# Patient Record
Sex: Female | Born: 1980 | Race: Black or African American | Hispanic: No | Marital: Married | Smoking: Never smoker
Health system: Southern US, Community
[De-identification: ages and names within clinical notes are randomized; demographics above are authoritative.]

## PROBLEM LIST (undated history)

## (undated) DIAGNOSIS — R519 Headache, unspecified: Secondary | ICD-10-CM

## (undated) DIAGNOSIS — C801 Malignant (primary) neoplasm, unspecified: Secondary | ICD-10-CM

## (undated) DIAGNOSIS — I10 Essential (primary) hypertension: Secondary | ICD-10-CM

## (undated) DIAGNOSIS — F419 Anxiety disorder, unspecified: Secondary | ICD-10-CM

## (undated) HISTORY — DX: Essential (primary) hypertension: I10

## (undated) HISTORY — PX: NO PAST SURGERIES: SHX2092

## (undated) HISTORY — PX: BREAST SURGERY: SHX581

---

## 2002-06-02 ENCOUNTER — Ambulatory Visit (HOSPITAL_COMMUNITY): Admission: RE | Admit: 2002-06-02 | Discharge: 2002-06-02 | Payer: Self-pay | Admitting: Family Medicine

## 2002-06-02 ENCOUNTER — Encounter: Payer: Self-pay | Admitting: Family Medicine

## 2003-01-11 ENCOUNTER — Other Ambulatory Visit: Admission: RE | Admit: 2003-01-11 | Discharge: 2003-01-11 | Payer: Self-pay | Admitting: Obstetrics and Gynecology

## 2005-11-11 ENCOUNTER — Ambulatory Visit (HOSPITAL_COMMUNITY): Admission: RE | Admit: 2005-11-11 | Discharge: 2005-11-11 | Payer: Self-pay | Admitting: Family Medicine

## 2007-09-11 ENCOUNTER — Emergency Department (HOSPITAL_COMMUNITY): Admission: EM | Admit: 2007-09-11 | Discharge: 2007-09-11 | Payer: Self-pay | Admitting: Emergency Medicine

## 2008-01-31 ENCOUNTER — Other Ambulatory Visit: Admission: RE | Admit: 2008-01-31 | Discharge: 2008-01-31 | Payer: Self-pay | Admitting: Obstetrics and Gynecology

## 2009-12-04 ENCOUNTER — Ambulatory Visit: Payer: Self-pay | Admitting: Family Medicine

## 2009-12-04 DIAGNOSIS — E663 Overweight: Secondary | ICD-10-CM | POA: Insufficient documentation

## 2009-12-04 DIAGNOSIS — J309 Allergic rhinitis, unspecified: Secondary | ICD-10-CM | POA: Insufficient documentation

## 2009-12-04 DIAGNOSIS — R5383 Other fatigue: Secondary | ICD-10-CM

## 2009-12-04 DIAGNOSIS — R5381 Other malaise: Secondary | ICD-10-CM

## 2009-12-04 DIAGNOSIS — I10 Essential (primary) hypertension: Secondary | ICD-10-CM | POA: Insufficient documentation

## 2009-12-12 ENCOUNTER — Encounter: Payer: Self-pay | Admitting: Family Medicine

## 2009-12-12 LAB — CONVERTED CEMR LAB
BUN: 13 mg/dL (ref 6–23)
Basophils Absolute: 0 10*3/uL (ref 0.0–0.1)
Basophils Relative: 1 % (ref 0–1)
CO2: 19 meq/L (ref 19–32)
Calcium: 9.8 mg/dL (ref 8.4–10.5)
Cholesterol: 189 mg/dL (ref 0–200)
Eosinophils Relative: 4 % (ref 0–5)
Glucose, Bld: 78 mg/dL (ref 70–99)
HCT: 42.9 % (ref 36.0–46.0)
Hemoglobin: 14.5 g/dL (ref 12.0–15.0)
Leukocytes, UA: NEGATIVE
MCHC: 33.8 g/dL (ref 30.0–36.0)
Monocytes Absolute: 0.4 10*3/uL (ref 0.1–1.0)
Nitrite: NEGATIVE
Potassium: 3.4 meq/L — ABNORMAL LOW (ref 3.5–5.3)
Protein, ur: NEGATIVE mg/dL
RDW: 13.8 % (ref 11.5–15.5)
TSH: 2.09 microintl units/mL (ref 0.350–4.500)
Urine Glucose: NEGATIVE mg/dL
VLDL: 14 mg/dL (ref 0–40)

## 2009-12-26 ENCOUNTER — Ambulatory Visit: Payer: Self-pay | Admitting: Family Medicine

## 2009-12-26 ENCOUNTER — Other Ambulatory Visit: Admission: RE | Admit: 2009-12-26 | Discharge: 2009-12-26 | Payer: Self-pay | Admitting: Family Medicine

## 2009-12-26 DIAGNOSIS — N760 Acute vaginitis: Secondary | ICD-10-CM | POA: Insufficient documentation

## 2009-12-28 ENCOUNTER — Encounter: Payer: Self-pay | Admitting: Family Medicine

## 2009-12-29 ENCOUNTER — Encounter: Payer: Self-pay | Admitting: Family Medicine

## 2009-12-29 LAB — CONVERTED CEMR LAB: GC Probe Amp, Genital: NEGATIVE

## 2010-01-03 ENCOUNTER — Telehealth: Payer: Self-pay | Admitting: Family Medicine

## 2010-01-03 LAB — CONVERTED CEMR LAB: Candida species: NEGATIVE

## 2010-03-05 ENCOUNTER — Ambulatory Visit: Payer: Self-pay | Admitting: Family Medicine

## 2010-10-21 NOTE — Progress Notes (Signed)
Summary: returning your call  Phone Note Call from Patient   Summary of Call: returning your call call back at [phone] Initial call taken by: Lind Guest,  January 03, 2010 8:04 AM  Follow-up for Phone Call        Patient aware that she has BV and meds sent to [facility] Follow-up by: Everitt Amber LPN,  January 03, 2010 8:09 AM

## 2010-10-21 NOTE — Assessment & Plan Note (Signed)
Summary: new pt   Vital Signs:  Patient profile:   30 year old female Menstrual status:  irregular Height:      72 inches Weight:      187 pounds BMI:     25.45 O2 Sat:      99 % Pulse rate:   96 / minute Pulse rhythm:   regular Resp:     16 per minute BP sitting:   158 / 120  (left arm) Cuff size:   large  Vitals Entered By: Everitt Amber LPN (December 04, 2009 8:51 AM)  Nutrition Counseling: Patient's BMI is greater than 25 and therefore counseled on weight management options. CC: New Patient Is Patient Diabetic? No Pain Assessment Patient in pain? no          Menstrual Status irregular   Primary Care Provider:  Syliva Overman MD  CC:  New Patient.  History of Present Illness: new pt eval for this young female whose primary concern todayis hypertension which is uncontrolled and for which  she  has been off meds for some time. She is slightly overweight and does not exercise. Her immunisation is not up to date, and she states she had an abn pap and was not offered the gardasil vaccine.  Preventive Screening-Counseling & Management  Alcohol-Tobacco     Smoking Status: never  Caffeine-Diet-Exercise     Does Patient Exercise: no      Drug Use:  no.    Current Medications (verified): 1)  None  Allergies (verified): No Known Drug Allergies  Past History:  Past Medical History: HTN since Feb 20, 2009 Overweight  Headache went to the ed with this in Feb 21, 2008 and at that time was told she had HTN, occasional headaches, often perimenstrual Seasonal allergies, wearly Spring , and fall Abn pap in 02/21/2008, was treated, Dr Emelda Fear  Past Surgical History: Denies surgical history  Family History: Mother-deceased- in February 21, 2007, age 35,Diabetes, ALS Father-Living-HTN, hyperlipidemia age 41 1 sister-living-healthy, thyroid ds, possible hypothyroid 1 brother-living-healthy  Social History: Higher education careers adviser, 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 Eyes:  Complains of vision loss-both eyes; denies blurring and discharge; wears prescription lenses x 6 months approx, poordistance vision. ENT:  Denies earache, hoarseness, nasal congestion, sinus pressure, and sore throat. CV:  Denies chest pain or discomfort, difficulty breathing while lying down, palpitations, shortness of breath with exertion, and swelling of feet. Resp:  Denies cough and sputum productive. GI:  Denies abdominal pain, change in bowel habits, constipation, diarrhea, nausea, and vomiting. GU:  Denies discharge, dysuria, and urinary frequency; 2 month h/o l;oweer. Derm:  Denies itching, lesion(s), and rash. Neuro:  Denies falling down, headaches, seizures, and sensation of room spinning. Psych:  Denies anxiety and depression. Endo:  Denies cold intolerance, excessive hunger, excessive thirst, excessive urination, heat intolerance, polyuria, and weight change. Heme:  Denies abnormal bruising and bleeding. Allergy:  Complains of seasonal allergies; mild.  Physical Exam  General:  Well-developed,well-nourished,in no acute distress; alert,appropriate and cooperative throughout examination HEENT: No facial asymmetry,  EOMI, No sinus tenderness, TM's Clear, oropharynx  pink and moist.   Chest: Clear to auscultation bilaterally.  CVS: S1, S2, No murmurs, No S3.   Abd: Soft, Nontender.  MS: Adequate ROM spine, hips, shoulders and knees.  Ext: No edema.   CNS: CN 2-12 intact, power tone and sensation normal throughout.   Skin: Intact, no visible lesions or rashes.  Psych: Good eye contact, normal affect.  Memory intact, not anxious or depressed appearing.     Impression & Recommendations:  Problem # 1:  HYPERTENSION  (ICD-401.9) Assessment Comment Only  Her updated medication list for this problem includes:    Maxzide-25 37.5-25 Mg Tabs (Triamterene-hctz) .Marland Kitchen... Take 1 tablet by mouth once a day  Orders: T-Basic Metabolic Panel [phone]) EKG w/ Interpretation (93000) T-Urinalysis (81003-65000)  BP today: 158/120  Problem # 2:  OVERWEIGHT (ICD-278.02) Assessment: Comment Only  Ht: 72 (12/04/2009)   Wt: 187 (12/04/2009)   BMI: 25.45 (12/04/2009), pt counselled on lifestyle change to facilitate weight loss  Problem # 3:  ALLERGIC RHINITIS (ICD-477.9)  Complete Medication List: 1)  Maxzide-25 37.5-25 Mg Tabs (Triamterene-hctz) .... Take 1 tablet by mouth once a day  Other Orders: T-CBC w/Diff ([id number]) T-Lipid Profile ([id number]) T-TSH [phone]) Tdap => 64yrs IM (44010) Admin 1st Vaccine (27253) Admin 1st Vaccine [facility]) [phone])  Patient Instructions: 1)  f/u in 3 weeks. 2)  Your blood pressure is very high. 3)  It is very important that you take medication daily as prescribed. 4)  It is very impt that you start daily physical activity fo at least 30 mins and eat alot of fruit  and veg and reduce your salt intake. 5)  TdaP today Prescriptions: MAXZIDE-25 37.5-25 MG TABS (TRIAMTERENE-HCTZ) Take 1 tablet by mouth once a day  #30 x 1   Entered and Authorized by:   Syliva Overman MD   Signed by:   Syliva Overman MD on 12/04/2009   Method used:   Electronically to        [facility] (retail)       [address]       [place], [place]  [address]       Ph: [phone]       Fax: [phone]   RxID:   [id number]     Tetanus/Td Vaccine    Vaccine Type: Tdap    Site: right deltoid    Mfr: Sanofi Pasteur    Dose: 0.5 ml    Route: IM    Given by: Everitt Amber LPN    Exp. Date: 12/27/2011    Lot #: [id number]

## 2010-10-21 NOTE — Assessment & Plan Note (Signed)
Summary: PHY   Vital Signs:  Patient profile:   30 year old female Menstrual status:  irregular Height:      72 inches Weight:      187.25 pounds BMI:     25.49 O2 Sat:      98 % Pulse rate:   103 / minute Pulse rhythm:   regular Resp:     16 per minute BP sitting:   140 / 100  (left arm) Cuff size:   regular  Vitals Entered By: Everitt Amber LPN (December 26, 1608 2:19 PM)  Nutrition Counseling: Patient's BMI is greater than 25 and therefore counseled on weight management options.  Vision Screening:Left eye with correction: 20 / 20 Right eye with correction: 20 / 20 Both eyes with correction: 20 / 20  Color vision testing: normal      Vision Entered By: Everitt Amber LPN (December 27, 9602 2:20 PM)   Primary Care Provider:  Syliva Overman MD   History of Present Illness: Reports  that she has been doing well.  She denies adverse side effects from the BP med she is taking. She also reports changing her dioet somewhat and starting some exercise. Denies recent fever or chills. Denies sinus pressure, nasal congestion , ear pain or sore throat. Denies chest congestion, or cough productive of sputum. Denies chest pain, palpitations, PND, orthopnea or leg swelling. Denies abdominal pain, nausea, vomitting, diarrhea or constipation. Denies change in bowel movements or bloody stool. Denies dysuria , frequency, incontinence or hesitancy. Denies  joint pain, swelling, or reduced mobility. Denies headaches, vertigo, seizures. Denies depression, anxiety or insomnia. Denies  rash, lesions, or itch.     Current Medications (verified): 1)  Maxzide-25 37.5-25 Mg Tabs (Triamterene-Hctz) .... Take 1 Tablet By Mouth Once A Day  Allergies (verified): No Known Drug Allergies  Review of Systems      See HPI Eyes:  Denies blurring and discharge. GU:  Complains of discharge; inc d/c after cycle, wants STD testing. Endo:  Denies cold intolerance, excessive hunger, excessive thirst,  excessive urination, heat intolerance, polyuria, and weight change. Heme:  Denies abnormal bruising and bleeding. Allergy:  Complains of seasonal allergies; denies hives or rash, itching eyes, and sneezing; mild at this time.  Physical Exam  General:  Well-developed,well-nourished,in no acute distress; alert,appropriate and cooperative throughout examination Head:  Normocephalic and atraumatic without obvious abnormalities. No apparent alopecia or balding. Eyes:  No corneal or conjunctival inflammation noted. EOMI. Perrla. Funduscopic exam benign, without hemorrhages, exudates or papilledema. Vision grossly normal. Ears:  External ear exam shows no significant lesions or deformities.  Otoscopic examination reveals clear canals, tympanic membranes are intact bilaterally without bulging, retraction, inflammation or discharge. Hearing is grossly normal bilaterally. Nose:  External nasal examination shows no deformity or inflammation. Nasal mucosa are pink and moist without lesions or exudates. Mouth:  Oral mucosa and oropharynx without lesions or exudates.  Teeth in good repair. Neck:  No deformities, masses, or tenderness noted. Chest Wall:  No deformities, masses, or tenderness noted. Breasts:  No mass, nodules, thickening, tenderness, bulging, retraction, inflamation, nipple discharge or skin changes noted.   Lungs:  Normal respiratory effort, chest expands symmetrically. Lungs are clear to auscultation, no crackles or wheezes. Heart:  Normal rate and regular rhythm. S1 and S2 normal without gallop, murmur, click, rub or other extra sounds. Abdomen:  Bowel sounds positive,abdomen soft and non-tender without masses, organomegaly or hernias noted. Genitalia:  Normal introitus for age, no external lesions, white  vaginal discharge, mucosa pink and moist, no vaginal or cervical lesions, no vaginal atrophy, no friaility or hemorrhage, normal uterus size and position, no adnexal masses or tenderness Msk:   No deformity or scoliosis noted of thoracic or lumbar spine.   Pulses:  R and L carotid,radial,femoral,dorsalis pedis and posterior tibial pulses are full and equal bilaterally Extremities:  No clubbing, cyanosis, edema, or deformity noted with normal full range of motion of all joints.   Neurologic:  No cranial nerve deficits noted. Station and gait are normal. Plantar reflexes are down-going bilaterally. DTRs are symmetrical throughout. Sensory, motor and coordinative functions appear intact. Skin:  Intact without suspicious lesions or rashes Cervical Nodes:  No lymphadenopathy noted Axillary Nodes:  No palpable lymphadenopathy Inguinal Nodes:  No significant adenopathy Psych:  Cognition and judgment appear intact. Alert and cooperative with normal attention span and concentration. No apparent delusions, illusions, hallucinations   Impression & Recommendations:  Problem # 1:  VAGINITIS (ICD-616.10) Assessment Comment Only  Orders: T-Wet Prep by Molecular Probe [phone]) T-Chlamydia & GC Probe, Genital ([id number])  Problem # 2:  OVERWEIGHT (ICD-278.02) Assessment: Unchanged  Ht: 72 (12/26/2009)   Wt: 187.25 (12/26/2009)   BMI: 25.49 (12/26/2009)  Problem # 3:  HYPERTENSION (ICD-401.9) Assessment: Improved  The following medications were removed from the medication list:    Maxzide-25 37.5-25 Mg Tabs (Triamterene-hctz) .Marland Kitchen... Take 1 tablet by mouth once a day Her updated medication list for this problem includes:    Maxzide 75-50 Mg Tabs (Triamterene-hctz) .Marland Kitchen... Take 1 tablet by mouth once a day  BP today: 140/100 Prior BP: 158/120 (12/04/2009)  Labs Reviewed: K+: 3.4 (12/12/2009) Creat: : 0.93 (12/12/2009)   Chol: 189 (12/12/2009)   HDL: 78 (12/12/2009)   LDL: 97 (12/12/2009)   TG: 72 (12/12/2009)  Complete Medication List: 1)  Maxzide 75-50 Mg Tabs (Triamterene-hctz) .... Take 1 tablet by mouth once a day  Other Orders: Pap Smear ([address])  Patient  Instructions: 1)  Please schedule a follow-up appointment in 2 months. 2)  It is important that you exercise regularly at least 20 minutes 5 times a week. If you develop chest pain, have severe difficulty breathing, or feel very tired , stop exercising immediately and seek medical attention. 3)  You need to lose weight. Consider a lower calorie diet and regular exercise.  4)  Dose inc on your bP med, pls start taking tWO daily till done then the new tab will be oNE daily Prescriptions: MAXZIDE 75-50 MG TABS (TRIAMTERENE-HCTZ) Take 1 tablet by mouth once a day  #90 x 0   Entered and Authorized by:   Syliva Overman MD   Signed by:   Syliva Overman MD on 12/26/2009   Method used:   Printed then faxed to ...       [facility] (retail)       [address]       [place], [place]  [address]       Ph: [phone]       Fax: [phone]   RxID:   [id number]      Orders Added: 1)  Est. Patient 18-39 years [99395] 2)  Pap Smear [88150] 3)  T-Wet Prep by Molecular Probe [phone] 4)  T-Chlamydia & GC Probe, Genital [[id number]]

## 2010-10-21 NOTE — Assessment & Plan Note (Signed)
Summary: office visit   Vital Signs:  Patient profile:   30 year old female Menstrual status:  irregular Height:      72 inches Weight:      183 pounds BMI:     24.91 O2 Sat:      93 % Pulse rate:   85 / minute Pulse rhythm:   regular Resp:     16 per minute BP sitting:   138 / 100  (left arm) Cuff size:   regular  Vitals Entered By: Everitt Amber LPN (March 05, 2010 8:38 AM) CC: Follow up chronic problems, BP   Primary Care Provider:  Syliva Overman MD  CC:  Follow up chronic problems and BP.  History of Present Illness: pt in primarily to review her blood pressure as well as to f/u on overweight as it pertAINS TO DIET AND EXERCISE.She has changed both with resultant weight loss, and she denies any adverse s/e from her blood pressure meds . specifically leg swelling , light headedness, or leg cramps. Reports  that she has been doing well. Denies recent fever or chills. Denies sinus pressure, nasal congestion , ear pain or sore throat. Denies chest congestion, or cough productive of sputum. Denies chest pain, palpitations, PND, orthopnea or leg swelling. Denies abdominal pain, nausea, vomitting, diarrhea or constipation. Denies change in bowel movements or bloody stool. Denies dysuria , frequency, incontinence or hesitancy. Denies  joint pain, swelling, or reduced mobility. Denies headaches, vertigo, seizures. Denies depression, anxiety or insomnia. Denies  rash, lesions, or itch.       Current Medications (verified): 1)  Maxzide 75-50 Mg Tabs (Triamterene-Hctz) .... Take 1 Tablet By Mouth Once A Day  Allergies (verified): No Known Drug Allergies  Review of Systems      See HPI Eyes:  Denies blurring, discharge, eye pain, and red eye. Endo:  Denies cold intolerance, excessive thirst, excessive urination, and heat intolerance. Heme:  Denies abnormal bruising and bleeding. Allergy:  Denies hives or rash and itching eyes.  Physical Exam  General:   Well-developed,well-nourished,in no acute distress; alert,appropriate and cooperative throughout examination HEENT: No facial asymmetry,  EOMI, No sinus tenderness, TM's Clear, oropharynx  pink and moist.   Chest: Clear to auscultation bilaterally.  CVS: S1, S2, No murmurs, No S3.   Abd: Soft, Nontender.  MS: Adequate ROM spine, hips, shoulders and knees.  Ext: No edema.   CNS: CN 2-12 intact, power tone and sensation normal throughout.   Skin: Intact, no visible lesions or rashes.  Psych: Good eye contact, normal affect.  Memory intact, not anxious or depressed appearing.    Impression & Recommendations:  Problem # 1:  OVERWEIGHT (ICD-278.02) Assessment Improved  Ht: 72 (03/05/2010)   Wt: 183 (03/05/2010)   BMI: 24.91 (03/05/2010)  Problem # 2:  HYPERTENSION (ICD-401.9) Assessment: Unchanged  The following medications were removed from the medication list:    Amlodipine Besylate 2.5 Mg Tabs (Amlodipine besylate) .Marland Kitchen... Take 1 tablet by mouth once a day Her updated medication list for this problem includes:    Maxzide 75-50 Mg Tabs (Triamterene-hctz) .Marland Kitchen... Take 1 tablet by mouth once a day    Amlodipine Besylate 2.5 Mg Tabs (Amlodipine besylate) .Marland Kitchen... Take 1 tablet by mouth once a day  BP today: 138/100 Prior BP: 140/100 (12/26/2009)  Labs Reviewed: K+: 3.4 (12/12/2009) Creat: : 0.93 (12/12/2009)   Chol: 189 (12/12/2009)   HDL: 78 (12/12/2009)   LDL: 97 (12/12/2009)   TG: 72 (12/12/2009)  Complete Medication List: 1)  Maxzide 75-50 Mg Tabs (Triamterene-hctz) .... Take 1 tablet by mouth once a day 2)  Tri-sprintec 0.18/0.215/0.25 Mg-35 Mcg Tabs (Norgestim-eth estrad triphasic) .... Use as directed 3)  Amlodipine Besylate 2.5 Mg Tabs (Amlodipine besylate) .... Take 1 tablet by mouth once a day  Patient Instructions: 1)  Please schedule a follow-up appointment in 2.5 months. 2)  It is important that you exercise regularly at least 20 minutes 5 times a week. If you develop  chest pain, have severe difficulty breathing, or feel very tired , stop exercising immediately and seek medical attention. 3)  You need to lose weight. Consider a lower calorie diet and regular exercise.  4)  Check your Blood Pressure regularly. If it is above: you should make an appointment. Prescriptions: AMLODIPINE BESYLATE 2.5 MG TABS (AMLODIPINE BESYLATE) Take 1 tablet by mouth once a day  #90 x 1   Entered by:   Everitt Amber LPN   Authorized by:   Syliva Overman MD   Signed by:   Everitt Amber LPN on 16/06/9603   Method used:   Printed then faxed to ...       [facility] (retail)       [address]       [place], [place]  [address]       Ph: [phone]       Fax: [phone]   RxID:   [id number] AMLODIPINE BESYLATE 2.5 MG TABS (AMLODIPINE BESYLATE) Take 1 tablet by mouth once a day  #90 x 1   Entered by:   Everitt Amber LPN   Authorized by:   Syliva Overman MD   Signed by:   Everitt Amber LPN on 07/18/2535   Method used:   Printed then faxed to ...       [facility] (retail)       [address]       [place], [place]  [address]       Ph: [phone]       Fax: [phone]   RxID:   [id number] AMLODIPINE BESYLATE 2.5 MG TABS (AMLODIPINE BESYLATE) Take 1 tablet by mouth once a day  #90 x 1   Entered and Authorized by:   Syliva Overman MD   Signed by:   Syliva Overman MD on 03/05/2010   Method used:   Printed then faxed to ...       [facility] (retail)       [address]       [place], [place]  [address]       Ph: [phone]       Fax: [phone]   RxID:   [id number] TRI-SPRINTEC 0.18/0.215/0.25 MG-35 MCG TABS (NORGESTIM-ETH ESTRAD TRIPHASIC) Use as directed  #1 x 11   Entered and Authorized by:   Syliva Overman MD   Signed by:   Syliva Overman MD on 03/05/2010   Method used:   Electronically to        [facility] (retail)        [address]       [place], [place]  [address]       Ph: [phone]       Fax: [phone]   RxID:   [id number] AMLODIPINE BESYLATE 2.5 MG TABS (AMLODIPINE BESYLATE) Take 1 tablet by mouth  once a day  #30 x 2   Entered and Authorized by:   Syliva Overman MD   Signed by:   Syliva Overman MD on 03/05/2010   Method used:   Electronically to        [facility], [facility] (retail)       [address]       [place], [place]  [address]       Ph: [phone]       Fax: [phone]   RxID:   [id number]

## 2013-02-06 ENCOUNTER — Observation Stay: Payer: Self-pay

## 2013-03-10 ENCOUNTER — Inpatient Hospital Stay: Payer: Self-pay

## 2013-03-10 LAB — CBC WITH DIFFERENTIAL/PLATELET
Eosinophil #: 0.2 10*3/uL (ref 0.0–0.7)
Eosinophil %: 1.3 %
HCT: 38.1 % (ref 35.0–47.0)
Lymphocyte %: 16.7 %
Neutrophil #: 10 10*3/uL — ABNORMAL HIGH (ref 1.4–6.5)
Neutrophil %: 77 %
Platelet: 275 10*3/uL (ref 150–440)
RBC: 4.15 10*6/uL (ref 3.80–5.20)
RDW: 14.9 % — ABNORMAL HIGH (ref 11.5–14.5)
WBC: 13 10*3/uL — ABNORMAL HIGH (ref 3.6–11.0)

## 2013-03-12 LAB — PLATELET COUNT: Platelet: 285 10*3/uL (ref 150–440)

## 2015-01-29 NOTE — H&P (Signed)
L&D Evaluation:  History:  HPI 34 year old Black female G1 P0 with CHTN on labetalol and an  EDC=03/15/2013 by  LMP 9/18/ 2013 ( and confirmed with a 7 week ultrasound) presents from office at 34 5/7 weeks for prolonged monitoring and an  AFI after having mild variable FHR decelerations noted during a NST in office. Baby has been active. She has been feeling some  BH contractions and will feel a more painful contraction occasionally. Denies bleeding or LOF. Hx of CHTN controlled on Atenolol, was switched  to Aldomet at her NOB visit with mildly elevated blood pressures and then changed  to labetalol 100 mgm BID at 12 weeks. Her blood pressures have been well controlled on this dose. At her most recent growth scan last week EFW=5#7 oz (60th %) and AFI was 17 cm.   Presents with FHR decels in office for antepartum testing   Patient's Medical History Hypertension    Patient's Surgical History none    Medications Pre Natal Vitamins  Labetalol 100 mgm BID    Allergies NKDA   Social History none    Family History Non-Contributory    ROS:  ROS see HPI   Exam:  Vital Signs stable  135/77    Urine Protein not completed   General no apparent distress   Mental Status clear    Abdomen gravid, non-tender   Fetal Position cephalic   Pelvic IZTIWP/80%/9   Mebranes Intact   FHT baseline 150 with accels to 160s to 170s, mod variability with 2 variable decels x 15-20 sec with quick return to baseline.   Ucx uterine irritability with frequent 20-30 sec contractions.   Other BPP 10/10. Tone 2/2. FM 2/2. Breathing motion 2/2. AF 2/2. Reactive NST:2/2. AFI =16.3 with SDP=5.53cm   Impression:  Impression IUP at 34 5/7 weeks with reactive NST, AFI WNL and BPP 10/10.   Plan:  Plan Discussed case with Dr Georgianne Fick. Will have patient  continue FKCs and report decreased FM.  Labor precautions. Follow up  on Friday 23 May for NST and  possible AFI. (closed on MAy 26 for Memorial Day)    Electronic Signatures: Karene Fry (CNM)  (Signed 19-May-14 18:50)  Authored: L&D Evaluation   Last Updated: 19-May-14 18:50 by Karene Fry (CNM)

## 2017-03-17 ENCOUNTER — Ambulatory Visit (INDEPENDENT_AMBULATORY_CARE_PROVIDER_SITE_OTHER): Payer: Self-pay | Admitting: Certified Nurse Midwife

## 2017-03-17 ENCOUNTER — Encounter: Payer: Self-pay | Admitting: Certified Nurse Midwife

## 2017-03-17 VITALS — BP 122/82 | HR 86 | Wt 234.0 lb

## 2017-03-17 DIAGNOSIS — O10919 Unspecified pre-existing hypertension complicating pregnancy, unspecified trimester: Secondary | ICD-10-CM

## 2017-03-17 DIAGNOSIS — B9689 Other specified bacterial agents as the cause of diseases classified elsewhere: Secondary | ICD-10-CM

## 2017-03-17 DIAGNOSIS — O99210 Obesity complicating pregnancy, unspecified trimester: Secondary | ICD-10-CM

## 2017-03-17 DIAGNOSIS — N926 Irregular menstruation, unspecified: Secondary | ICD-10-CM

## 2017-03-17 DIAGNOSIS — O09529 Supervision of elderly multigravida, unspecified trimester: Secondary | ICD-10-CM

## 2017-03-17 DIAGNOSIS — Z124 Encounter for screening for malignant neoplasm of cervix: Secondary | ICD-10-CM

## 2017-03-17 DIAGNOSIS — Z113 Encounter for screening for infections with a predominantly sexual mode of transmission: Secondary | ICD-10-CM

## 2017-03-17 DIAGNOSIS — N760 Acute vaginitis: Secondary | ICD-10-CM

## 2017-03-17 LAB — POCT URINE PREGNANCY: Preg Test, Ur: POSITIVE — AB

## 2017-03-17 MED ORDER — METRONIDAZOLE 500 MG PO TABS: ORAL_TABLET | ORAL | 0 refills | Status: DC

## 2017-03-17 MED ORDER — PROVIDA OB 20-20-1.25 MG PO CAPS: 1.0000 | ORAL_CAPSULE | Freq: Every day | ORAL | 11 refills | Status: AC

## 2017-03-17 MED ORDER — FOLIC ACID 1 MG PO TABS: 1.0000 mg | ORAL_TABLET | Freq: Every day | ORAL | 3 refills | Status: DC

## 2017-03-17 NOTE — Progress Notes (Signed)
Pt reports no problems.   

## 2017-03-17 NOTE — Progress Notes (Signed)
New Obstetric Patient H&P    Chief Complaint: "Desires prenatal care"   History of Present Illness: Patient is a 36 y.o. G2 P61 Black female, LMP 02/11/2017 presents with amenorrhea and positive home pregnancy test. Based on her  LMP, her EDD is 11/18/2017 and her EGA is 4wk6d. Cycles are regularsince discontinuing her BCPs in December, and occur approximately every : 28 days. Her last pap smear was 10/04/2015 and was ASCUS with NEGATIVE high risk HPV.    She had a urine pregnancy test which was positive 1 week(s)  ago. Her last menstrual period was normal. Since her LMP she claims she has experienced abdominal cramping and mild nausea (smell aversions). She denies vaginal bleeding. Her past medical history is remarkable for chronic hypertension. She has not been off antihypertensive medicine for more than a two years. She also has a BMI=31 kg/m2.  Her prior pregnancies are notable for chronic HTN, Group B strep, and thick MSAF. Her labor was induced at 39 weeks for [facility]. She was on labetalol 100 mgm BID during the pregnancy  Since her LMP, she admits to the use of tobacco products  no She claims she has gained   no pounds since the start of her pregnancy.  There are cats in the home in the home  no If yes NA She admits close contact with children on a regular basis  yes  She has had chicken pox in the past yes She has had Tuberculosis exposures, symptoms, or previously tested positive for TB   no Current or past history of domestic violence. no  Genetic Screening/Teratology Counseling: (Includes patient, baby's father, or anyone in either family with:)   21. Patient's age >/= 29 at [facility]  yes 2. Thalassemia ([place], [place], [place], or Asian background): MCV<80  no 3. Neural tube defect (meningomyelocele, spina bifida, anencephaly)  no 4. Congenital heart defect  no  5. Down syndrome  no 6. Tay-Sachs (Jewish, [place])  no 7. Canavan's Disease  no 8. Sickle cell  disease or trait (African)  no  9. Hemophilia or other blood disorders  no  10. Muscular dystrophy  no  11. Cystic fibrosis  no  12. Huntington's Chorea  no  13. Mental retardation/autism  Yes father of baby's 32 year old sister has cerebral palsy 22. Other inherited genetic or chromosomal disorder  no 15. Maternal metabolic disorder (DM, PKU, etc)  no 16. Patient or FOB with a child with a birth defect not listed above no  16a. Patient or FOB with a birth defect themselves no 17. Recurrent pregnancy loss, or stillbirth  no  18. Any medications since LMP other than prenatal vitamins (include vitamins, supplements, OTC meds, drugs, alcohol)  Yes, miconazole vaginal cream 19. Any other genetic/environmental exposure to discuss  no  Infection History:   1. Lives with someone with TB or TB exposed  no  2. Patient or partner has history of genital herpes  no 3. Rash or viral illness since LMP  no 4. History of STI (GC, CT, HPV, syphilis, HIV)  no 5. History of recent travel :  no  Other pertinent information: FOB is her fiance and father of their first child: Reggie, age 41    Review of Systems:10 point review of systems negative unless otherwise noted in HPI  Past Medical History:  Past Medical History:  Diagnosis Date  . Hypertension     Past Surgical History:  Past Surgical History:  Procedure Laterality  Date  . NO PAST SURGERIES      Gynecologic History: Patient's last menstrual period was 02/11/2017 (exact date).  Obstetric History:  OB History  Gravida Para Term Preterm AB Living  2 1 1     1   SAB TAB Ectopic Multiple Live Births          1    # Outcome Date GA Lbr Len/2nd Weight Sex Delivery Anes PTL Lv  2 Current           1 Term 03/12/13 [redacted]w[redacted]d  6 lb 11 oz (3.033 kg) F Vag-Spont EPI N LIV     Complications: Thick meconium stained amniotic fluid      Family History:  Family History  Problem Relation Age of Onset  . Diabetes Mother   . ALS Mother   .  Hypertension Father   . Alzheimer's disease Father   . Hyperlipidemia Father   . Dementia Father   . Hypothyroidism Sister   . Hyperlipidemia Brother   . Hypertension Brother   . Diabetes Maternal Aunt     Social History:  Social History   Social History  . Marital status: Single    Spouse name: Reggie  . Number of children: 1  . Years of education: N/A   Occupational History  . program assistant    Social History Main Topics  . Smoking status: Never Smoker  . Smokeless tobacco: Never Used  . Alcohol use Yes     Comment: prior to positive ICON  . Drug use: No  . Sexual activity: Yes    Partners: Male    Birth control/ protection: None   Other Topics Concern  . Not on file   Social History Narrative  . No narrative on file    Allergies:  No Known Allergies  Medications: Prenatal vitamins daily Physical Exam Vitals: BP 122/82   Pulse 86   Wt 234 lb (106.1 kg)   LMP 02/11/2017 (Exact Date)   BMI 31.74 kg/m   General: BF in NAD HEENT: normocephalic, anicteric Thyroid: no enlargement, no palpable nodules Pulmonary: No increased work of breathing, CTAB Cardiovascular: RRR, no murmurs appreciated Abdomen: soft, non-tender, non-distended.  Umbilicus without lesions.  No hepatomegaly, splenomegaly or masses palpable. No evidence of hernia  Genitourinary:  External: Normal external female genitalia.  Normal urethral meatus, normal Bartholin's and Skene's glands.    Vagina: Normal vaginal mucosa, no evidence of prolapse, white homogenous discharge   Cervix: Grossly normal in appearance, no bleeding  Uterus: AF, NSSC, mobile, normal contour.   Adnexa: ovaries non-enlarged, no adnexal masses  Rectal: deferred Extremities: no edema, erythema, or tenderness Neurologic: Grossly intact Psychiatric: mood appropriate, affect full Wet prep positive for clue cells, negative for hyphae and Trich  Assessment: 36 y.o. G2 P1001 with early pregnancy AMA CHTN, not  currently on medication Bacterial vaginosis BMI=31  Plan: 1) Avoid alcoholic beverages. 2) Monitor blood pressures closely. NOB labs including CMP and PC ratio today  3) Discontinue the use of all non-medicinal drugs and chemicals.  4) Take prenatal vitamins daily. Start folic acid 1 mgm since BMI>31 5) Nutrition, food safety (fish, cheese advisories, and high nitrite foods) and exercise discussed. 6) Discussed prenatal care at [facility] and current providers.  7) Genetic Screening, such as with 1st Trimester Screening, cell free fetal DNA, AFP testing, and Ultrasound, as well as with amniocentesis and CVS as appropriate, is discussed with patient. At the conclusion of today's visit patient requested genetic testing 8) Patient is  asked about travel to areas at risk for the Zika virus, and counseled to avoid travel and exposure to mosquitoes or sexual partners who may have themselves been exposed to the virus. Testing is discussed, and will be ordered as appropriate.  9) Advised of BV and treatment with Flagyl 500 mgm BID x 7 days 10)RTO in 4 weeks for viability scan and blood pressure check. Needs early glucola screen and will get schedule that with her genetic testing.  Dalia Heading, CNM

## 2017-03-18 ENCOUNTER — Other Ambulatory Visit: Payer: Self-pay

## 2017-03-18 DIAGNOSIS — Z3490 Encounter for supervision of normal pregnancy, unspecified, unspecified trimester: Secondary | ICD-10-CM

## 2017-03-18 LAB — RPR+RH+ABO+RUB AB+AB SCR+CB...
ANTIBODY SCREEN: NEGATIVE
HIV Screen 4th Generation wRfx: NONREACTIVE
Hematocrit: 40.4 % (ref 34.0–46.6)
Hemoglobin: 14 g/dL (ref 11.1–15.9)
Hepatitis B Surface Ag: NEGATIVE
MCH: 30.9 pg (ref 26.6–33.0)
MCHC: 34.7 g/dL (ref 31.5–35.7)
MCV: 89 fL (ref 79–97)
PLATELETS: 324 10*3/uL (ref 150–379)
RBC: 4.53 x10E6/uL (ref 3.77–5.28)
RDW: 14.8 % (ref 12.3–15.4)
RPR Ser Ql: NONREACTIVE
RUBELLA: 30.5 {index} (ref >0.99)
Rh Factor: POSITIVE
VARICELLA: 1074 {index} (ref >165)
WBC: 7.8 10*3/uL (ref 3.4–10.8)

## 2017-03-18 LAB — HEMOGLOBINOPATHY EVALUATION
HEMOGLOBIN F QUANTITATION: 0 % (ref 0.0–2.0)
HGB C: 0 %
HGB S: 0 %
HGB VARIANT: 0 %
Hemoglobin A2 Quantitation: 2.1 % (ref 1.8–3.2)
Hgb A: 97.9 % (ref 96.4–98.8)

## 2017-03-18 LAB — PROTEIN / CREATININE RATIO, URINE
Creatinine, Urine: 21.3 mg/dL
PROTEIN UR: 4.5 mg/dL
PROTEIN/CREAT RATIO: 211 mg/g{creat} — AB (ref 0–200)

## 2017-03-18 MED ORDER — FOLIC ACID 1 MG PO TABS: 1.0000 mg | ORAL_TABLET | Freq: Every day | ORAL | 3 refills | Status: DC

## 2017-03-19 LAB — URINE CULTURE

## 2017-03-20 LAB — IGP,CTNG,APTIMAHPV
Chlamydia, Nuc. Acid Amp: NEGATIVE
GONOCOCCUS BY NUCLEIC ACID AMP: NEGATIVE
HPV Aptima: NEGATIVE
PAP SMEAR COMMENT: 0

## 2017-03-23 ENCOUNTER — Encounter: Payer: Self-pay | Admitting: Certified Nurse Midwife

## 2017-03-23 DIAGNOSIS — O09529 Supervision of elderly multigravida, unspecified trimester: Secondary | ICD-10-CM | POA: Insufficient documentation

## 2017-03-23 DIAGNOSIS — O99210 Obesity complicating pregnancy, unspecified trimester: Secondary | ICD-10-CM | POA: Insufficient documentation

## 2017-03-23 DIAGNOSIS — O169 Unspecified maternal hypertension, unspecified trimester: Secondary | ICD-10-CM | POA: Insufficient documentation

## 2017-03-23 LAB — POCT WET PREP (WET MOUNT): TRICHOMONAS WET PREP HPF POC: ABSENT

## 2017-04-14 ENCOUNTER — Other Ambulatory Visit: Payer: Medicaid Other

## 2017-04-14 ENCOUNTER — Encounter: Payer: Medicaid Other | Admitting: Obstetrics & Gynecology

## 2017-04-27 ENCOUNTER — Ambulatory Visit: Payer: Self-pay

## 2017-04-27 ENCOUNTER — Other Ambulatory Visit: Payer: Self-pay | Admitting: Certified Nurse Midwife

## 2017-04-27 ENCOUNTER — Ambulatory Visit (INDEPENDENT_AMBULATORY_CARE_PROVIDER_SITE_OTHER): Payer: Medicaid Other | Admitting: Obstetrics and Gynecology

## 2017-04-27 VITALS — BP 124/80 | Wt 232.0 lb

## 2017-04-27 DIAGNOSIS — O09529 Supervision of elderly multigravida, unspecified trimester: Secondary | ICD-10-CM

## 2017-04-27 DIAGNOSIS — O10019 Pre-existing essential hypertension complicating pregnancy, unspecified trimester: Secondary | ICD-10-CM

## 2017-04-27 DIAGNOSIS — O09521 Supervision of elderly multigravida, first trimester: Secondary | ICD-10-CM

## 2017-04-27 DIAGNOSIS — O99210 Obesity complicating pregnancy, unspecified trimester: Secondary | ICD-10-CM

## 2017-04-27 NOTE — Progress Notes (Signed)
Prenatal Visit Note Date: 04/27/2017 Clinic: [facility]  Subjective:  Erika Figueroa is a 36 y.o. G2P1001 at [redacted]w[redacted]d being seen today for ongoing prenatal care.  She is currently monitored for the following issues for this high-risk pregnancy and has OVERWEIGHT; HYPERTENSION; ALLERGIC RHINITIS; VAGINITIS; FATIGUE; Encounter for supervision of high risk multigravida of advanced maternal age, antepartum; Hypertension in pregnancy, antepartum; Obesity in pregnancy; and Supervision of elderly multigravida in first trimester on her problem list.   ----------------------------------------------------------------------------------- Patient reports no complaints.   Contractions: Not present. Vag. Bleeding: None.  Movement: Absent. Denies leaking of fluid.  U/S confirms EDD.  Patient desires NIPT.  Will get 1h gtt, CMP, and NIPT prior to next appt.   Notes some pressure after voiding.  No other urinary sx.  ----------------------------------------------------------------------------------- The following portions of the patient's history were reviewed and updated as appropriate: allergies, current medications, past family history, past medical history, past social history, past surgical history and problem list. Problem list updated.  Objective:   Vitals:   04/27/17 1608  BP: 124/80  Weight: 232 lb (105.2 kg)  Total weight gain for pregnancy: -2 lb (-0.907 kg)   Fetal Status: Fetal Heart Rate (bpm): Present   Movement: Absent     General:  Alert, oriented and cooperative. Patient is in no acute distress.  Skin: Skin is warm and dry. No rash noted.   Cardiovascular: Normal heart rate noted  Respiratory: Normal respiratory effort, no problems with respiration noted  Abdomen: Soft, gravid, appropriate for gestational age. Pain/Pressure: Present     Pelvic:  Cervical exam deferred        Extremities: Normal range of motion.     Mental Status: Normal mood and affect. Normal behavior. Normal  judgment and thought content.   Urinalysis: Urine Protein: Negative Urine Glucose: Negative  Assessment and Plan:  Pregnancy: G2P1001 at [redacted]w[redacted]d  1. Encounter for supervision of high risk multigravida of advanced maternal age, antepartum - US OB Transvaginal - Comprehensive metabolic panel; Future - Glucose, 1 hour gestational; Future - informaSeq(SM) with XY Analysis; Future - Urine Culture 2. Pre-existing essential hypertension during pregnancy, antepartum - Comprehensive metabolic panel; Future 3. Obesity in pregnancy - Glucose, 1 hour gestational; Future 4. Supervision of elderly multigravida in first trimester - informaSeq(SM) with XY Analysis; Future  Please refer to After Visit Summary for other counseling recommendations.   Return in about 4 weeks (around 05/25/2017) for schedule 1h gtt when patient can and ROB in 4 weeks (do not have to be same day).  [name], MD 04/27/2017 5:12 PM

## 2017-04-30 ENCOUNTER — Other Ambulatory Visit: Payer: Medicaid Other

## 2017-04-30 DIAGNOSIS — O09529 Supervision of elderly multigravida, unspecified trimester: Secondary | ICD-10-CM

## 2017-04-30 DIAGNOSIS — O10019 Pre-existing essential hypertension complicating pregnancy, unspecified trimester: Secondary | ICD-10-CM

## 2017-04-30 DIAGNOSIS — O99210 Obesity complicating pregnancy, unspecified trimester: Secondary | ICD-10-CM

## 2017-04-30 DIAGNOSIS — O09521 Supervision of elderly multigravida, first trimester: Secondary | ICD-10-CM

## 2017-04-30 LAB — URINE CULTURE: Organism ID, Bacteria: NO GROWTH

## 2017-05-07 ENCOUNTER — Encounter: Payer: Self-pay | Admitting: Obstetrics and Gynecology

## 2017-05-07 LAB — INFORMASEQ(SM) WITH XY ANALYSIS

## 2017-05-13 NOTE — Telephone Encounter (Signed)
-----   Message from Cleophas Dunker, [place] sent at 05/12/2017  4:44 PM EDT ----- Contact: pt Pt has questions.  [phone]

## 2017-05-13 NOTE — Telephone Encounter (Signed)
Urine culture negative. Genetic testing not done/not back. Not sure what was 'abnormal' Let her know then forward to [facility]

## 2017-05-17 ENCOUNTER — Telehealth: Payer: Self-pay

## 2017-05-17 NOTE — Telephone Encounter (Signed)
Left generic VM. I have this patient a message through [place] on 8/17 letting her know about the results.  If she calls back and I am not available, please direct her to MyChart and see my message from 8/17.  I will still call her back, if she has questions.  She simply needs to repeat the test.

## 2017-05-17 NOTE — Telephone Encounter (Signed)
Message forwarded to [place].

## 2017-05-17 NOTE — Telephone Encounter (Signed)
Pt has been waiting a week to talk to someone about her lab results.  Please call.  [phone].

## 2017-05-18 NOTE — Telephone Encounter (Signed)
Please let SDJ know if pt calls back and has more questions. She needs to come in to repeat test since it was not resulted

## 2017-05-18 NOTE — Telephone Encounter (Signed)
Patient has returned call regarding results. It does not appear that she has viewed results in [place]. Requested to speak to SDJ or his nurse. Cb# [phone] thank you.

## 2017-05-18 NOTE — Telephone Encounter (Signed)
Pt coming in tomorrow to get lab redrawn. Can you put in the order?

## 2017-05-19 ENCOUNTER — Other Ambulatory Visit: Payer: Medicaid Other

## 2017-05-19 DIAGNOSIS — Z1379 Encounter for other screening for genetic and chromosomal anomalies: Secondary | ICD-10-CM

## 2017-05-26 ENCOUNTER — Other Ambulatory Visit: Payer: Self-pay

## 2017-05-26 ENCOUNTER — Ambulatory Visit (INDEPENDENT_AMBULATORY_CARE_PROVIDER_SITE_OTHER): Payer: Self-pay | Admitting: Obstetrics and Gynecology

## 2017-05-26 VITALS — BP 128/84 | Wt 230.0 lb

## 2017-05-26 DIAGNOSIS — O09521 Supervision of elderly multigravida, first trimester: Secondary | ICD-10-CM

## 2017-05-26 DIAGNOSIS — O99210 Obesity complicating pregnancy, unspecified trimester: Secondary | ICD-10-CM

## 2017-05-26 DIAGNOSIS — O09529 Supervision of elderly multigravida, unspecified trimester: Secondary | ICD-10-CM

## 2017-05-26 DIAGNOSIS — Z3A14 14 weeks gestation of pregnancy: Secondary | ICD-10-CM

## 2017-05-26 DIAGNOSIS — O10019 Pre-existing essential hypertension complicating pregnancy, unspecified trimester: Secondary | ICD-10-CM

## 2017-05-26 MED ORDER — ASPIRIN EC 81 MG PO TBEC: 81.0000 mg | DELAYED_RELEASE_TABLET | Freq: Every day | ORAL | Status: DC

## 2017-05-26 NOTE — Progress Notes (Signed)
Routine Prenatal Care Visit  Subjective  Erika Figueroa is a 36 y.o. G2P1001 at [redacted]w[redacted]d being seen today for ongoing prenatal care.  She is currently monitored for the following issues for this high-risk pregnancy and has OVERWEIGHT; HYPERTENSION; ALLERGIC RHINITIS; VAGINITIS; FATIGUE; Encounter for supervision of high risk multigravida of advanced maternal age, antepartum; Hypertension in pregnancy, antepartum; Obesity in pregnancy; and Supervision of elderly multigravida in first trimester on her problem list.  ----------------------------------------------------------------------------------- Patient reports no complaints.   Contractions: Not present. Vag. Bleeding: None.  Movement: Absent.  1h gtt today along with baseline CMP.  Start bASA today. Patient states she will.  ----------------------------------------------------------------------------------- The following portions of the patient's history were reviewed and updated as appropriate: allergies, current medications, past family history, past medical history, past social history, past surgical history and problem list. Problem list updated.   Objective  Blood pressure 128/84, weight 230 lb (104.3 kg), last menstrual period 02/11/2017. Pregravid weight 234 lb (106.1 kg) Total Weight Gain -4 lb (-1.814 kg) Urinalysis: Urine Protein: Negative Urine Glucose: Negative  Fetal Status: Fetal Heart Rate (bpm): present   Movement: Absent     General:  Alert, oriented and cooperative. Patient is in no acute distress.  Skin: Skin is warm and dry. No rash noted.   Cardiovascular: Normal heart rate noted  Respiratory: Normal respiratory effort, no problems with respiration noted  Abdomen: Soft, gravid, appropriate for gestational age. Pain/Pressure: Absent     Pelvic:  Cervical exam deferred        Extremities: Normal range of motion.     Mental Status: Normal mood and affect. Normal behavior. Normal judgment and thought content.    Assessment   36 y.o. G2P1001 at [redacted]w[redacted]d by  11/18/2017, by Last Menstrual Period presenting for routine prenatal visit  Plan   pregnancy #2 Problems (from 03/17/17 to present)    Problem Noted Resolved   Supervision of elderly multigravida in first trimester 04/27/2017 by Will Bonnet, MD No   Encounter for supervision of high risk multigravida of advanced maternal age, antepartum 03/23/2017 by Dalia Heading, CNM No   Overview Addendum 05/26/2017 10:37 AM by Will Bonnet, MD     Clinic [facility] Prenatal Labs  Dating LMP = 10 week u/s Blood type: O/Positive/-- (06/27 1205) O POS  Genetic Screen 1 Screen:    AFP:     Quad:     NIPS: Antibody:Negative (06/27 1205)neg  Anatomic [place]  Rubella: 30.50 (06/27 1205)Immune  GTT Early:               Third trimester:  RPR: Non Reactive (06/27 1205) non reactive  Flu vaccine  HBsAg: Negative (06/27 1205) negative  TDaP vaccine                                               Rhogam: HIV:   non reactive  Baby Food                                               GBS: (For PCN allergy, check sensitivities)  Contraception  Pap: NIL 03/17/17  Circumcision  Varicella: Immune  Pediatrician  GC/ Chlamydia N/N  Support Person  P/C ratio 221 mgm at NOB/  Hypertension in pregnancy, antepartum 03/23/2017 by Dalia Heading, CNM No   Overview Signed 05/26/2017 10:38 AM by Will Bonnet, MD    [x]  Aspirin 81 mg daily after 12 weeks; discontinue after 36 weeks [x]  baseline labs with CBC, CMP, urine protein/creatinine ratio [ ]  no BP meds unless BPs become elevated [ ]  ultrasound for growth at 28, 32, 36 weeks [ ]  Baseline EKG   Current antihypertensives:  None   Baseline and surveillance labs (pulled in from [facility], refresh links as needed)  Lab Results  Component Value Date   PLT 324 03/17/2017   CREATININE 0.93 12/12/2009   Antenatal Testing CHTN - O10.919  Group I  BP < 140/90, no preeclampsia, AGA,  nml AFV, +/- meds    Group II BP >  140/90, on meds, no preeclampsia, AGA, nml AFV  20-28-34-38  20-24-28-32-35-38  32//2 x wk  28//BPP wkly then 32//2 x wk  40 no meds; 39 meds  PRN or 37  Pre-eclampsia  GHTN - O13.9/Preeclampsia without severe features  - O14.00   Preeclampsia with severe features - O14.10  Q 3-4wks  Q 2 wks  28//BPP wkly then 32//2 x wk  Inpatient  37  PRN or 34       Obesity in pregnancy 03/23/2017 by Dalia Heading, CNM No   Overview Signed 03/23/2017 12:02 PM by Dalia Heading, CNM    BMI 31. Add folic acid. Early glucola screen today        Please refer to After Visit Summary for other counseling recommendations.   Return in about 4 weeks (around 06/23/2017) for schedule anatomy u/s and Routine Prenatal Appointment.  [name], MD  05/26/2017 10:45 AM

## 2017-05-27 LAB — URINE DRUG PANEL 7
AMPHETAMINES, URINE: NEGATIVE ng/mL
BARBITURATE QUANT UR: NEGATIVE ng/mL
BENZODIAZEPINE QUANT UR: NEGATIVE ng/mL
CANNABINOID QUANT UR: NEGATIVE ng/mL
COCAINE (METAB.): NEGATIVE ng/mL
Opiate Quant, Ur: NEGATIVE ng/mL
PCP Quant, Ur: NEGATIVE ng/mL

## 2017-05-27 LAB — COMPREHENSIVE METABOLIC PANEL
A/G RATIO: 2 (ref 1.2–2.2)
ALBUMIN: 4.5 g/dL (ref 3.5–5.5)
ALK PHOS: 79 IU/L (ref 39–117)
ALT: 14 IU/L (ref 0–32)
AST: 20 IU/L (ref 0–40)
BILIRUBIN TOTAL: 0.5 mg/dL (ref 0.0–1.2)
BUN / CREAT RATIO: 9 (ref 9–23)
BUN: 6 mg/dL (ref 6–20)
CHLORIDE: 106 mmol/L (ref 96–106)
CO2: 17 mmol/L — ABNORMAL LOW (ref 20–29)
Calcium: 9.4 mg/dL (ref 8.7–10.2)
Creatinine, Ser: 0.65 mg/dL (ref 0.57–1.00)
GFR calc non Af Amer: 116 mL/min/{1.73_m2} (ref >59)
GFR, EST AFRICAN AMERICAN: 134 mL/min/{1.73_m2} (ref >59)
GLOBULIN, TOTAL: 2.2 g/dL (ref 1.5–4.5)
Glucose: 98 mg/dL (ref 65–99)
Potassium: 3.7 mmol/L (ref 3.5–5.2)
SODIUM: 140 mmol/L (ref 134–144)
TOTAL PROTEIN: 6.7 g/dL (ref 6.0–8.5)

## 2017-05-27 LAB — GLUCOSE, 1 HOUR GESTATIONAL: GESTATIONAL DIABETES SCREEN: 88 mg/dL (ref 65–139)

## 2017-05-28 LAB — INFORMASEQ(SM) WITH XY ANALYSIS
FETAL FRACTION (%): 4.7
FETAL NUMBER: 1
GESTATIONAL AGE AT COLLECTION: 13.9 wk
Weight: 229 [lb_av]

## 2017-06-02 ENCOUNTER — Telehealth: Payer: Self-pay

## 2017-06-02 NOTE — Telephone Encounter (Signed)
Pt calling requesting results from genetic testing, does pt need to repeat this test again? Low fetal DNA for the second time. Please advise

## 2017-06-07 NOTE — Telephone Encounter (Signed)
The fetal fraction was 4.7%, which is sufficient. So, we should be OK.  I'm not sure if Opal Sidles has responded to this or not.  But, I wanted to let her know that everything came back ok.

## 2017-06-08 NOTE — Telephone Encounter (Signed)
Erika Figueroa spoke with pt

## 2017-06-23 ENCOUNTER — Ambulatory Visit (INDEPENDENT_AMBULATORY_CARE_PROVIDER_SITE_OTHER): Payer: Medicaid Other

## 2017-06-23 ENCOUNTER — Ambulatory Visit (INDEPENDENT_AMBULATORY_CARE_PROVIDER_SITE_OTHER): Payer: Medicaid Other | Admitting: Certified Nurse Midwife

## 2017-06-23 VITALS — BP 110/70 | Wt 226.0 lb

## 2017-06-23 DIAGNOSIS — O09521 Supervision of elderly multigravida, first trimester: Secondary | ICD-10-CM

## 2017-06-23 DIAGNOSIS — Z3A18 18 weeks gestation of pregnancy: Secondary | ICD-10-CM

## 2017-06-23 DIAGNOSIS — O09529 Supervision of elderly multigravida, unspecified trimester: Secondary | ICD-10-CM

## 2017-06-23 DIAGNOSIS — O43112 Circumvallate placenta, second trimester: Secondary | ICD-10-CM

## 2017-06-23 NOTE — Progress Notes (Signed)
Anatomy ultrasound. It's a BOY!

## 2017-06-23 NOTE — Progress Notes (Signed)
ROB and anatomy scan at 18wk6d CGA 19wk4d with normal anatomy except for circumvallate placenta Advised will be following with growth scans in third trimester for circumvallate placenta and history of hypertension Informaseq -normal XY 1 hour GTT=88 ROB in 4 weeks Erika Figueroa, CNM

## 2017-07-20 ENCOUNTER — Ambulatory Visit (INDEPENDENT_AMBULATORY_CARE_PROVIDER_SITE_OTHER): Payer: Medicaid Other | Admitting: Obstetrics and Gynecology

## 2017-07-20 VITALS — BP 118/74 | Wt 232.0 lb

## 2017-07-20 DIAGNOSIS — O10019 Pre-existing essential hypertension complicating pregnancy, unspecified trimester: Secondary | ICD-10-CM

## 2017-07-20 DIAGNOSIS — O43112 Circumvallate placenta, second trimester: Secondary | ICD-10-CM

## 2017-07-20 DIAGNOSIS — Z113 Encounter for screening for infections with a predominantly sexual mode of transmission: Secondary | ICD-10-CM

## 2017-07-20 DIAGNOSIS — O09529 Supervision of elderly multigravida, unspecified trimester: Secondary | ICD-10-CM

## 2017-07-20 DIAGNOSIS — Z3A22 22 weeks gestation of pregnancy: Secondary | ICD-10-CM

## 2017-07-20 DIAGNOSIS — Z131 Encounter for screening for diabetes mellitus: Secondary | ICD-10-CM

## 2017-07-20 DIAGNOSIS — O09521 Supervision of elderly multigravida, first trimester: Secondary | ICD-10-CM

## 2017-07-20 DIAGNOSIS — O99210 Obesity complicating pregnancy, unspecified trimester: Secondary | ICD-10-CM

## 2017-07-20 NOTE — Progress Notes (Signed)
Routine Prenatal Care Visit  Subjective  Erika Figueroa is a 35 y.o. G2P1001 at [redacted]w[redacted]d being seen today for ongoing prenatal care.  She is currently monitored for the following issues for this high-risk pregnancy and has OVERWEIGHT; HYPERTENSION; ALLERGIC RHINITIS; VAGINITIS; FATIGUE; Encounter for supervision of high risk multigravida of advanced maternal age, antepartum; Hypertension in pregnancy, antepartum; Obesity in pregnancy; Supervision of elderly multigravida in first trimester; and Circumvallate placenta in second trimester on her problem list.  ----------------------------------------------------------------------------------- Patient reports no complaints.   Contractions: Not present. Vag. Bleeding: None.  Movement: Present. Denies leaking of fluid.  ----------------------------------------------------------------------------------- The following portions of the patient's history were reviewed and updated as appropriate: allergies, current medications, past family history, past medical history, past social history, past surgical history and problem list. Problem list updated.   Objective  Blood pressure 118/74, weight 232 lb (105.2 kg), last menstrual period 02/11/2017. Pregravid weight 234 lb (106.1 kg) Total Weight Gain -2 lb (-0.907 kg) Urinalysis: Urine Protein: Negative Urine Glucose: Negative  Fetal Status: Fetal Heart Rate (bpm): 145   Movement: Present     General:  Alert, oriented and cooperative. Patient is in no acute distress.  Skin: Skin is warm and dry. No rash noted.   Cardiovascular: Normal heart rate noted  Respiratory: Normal respiratory effort, no problems with respiration noted  Abdomen: Soft, gravid, appropriate for gestational age. Pain/Pressure: Absent     Pelvic:  Cervical exam deferred        Extremities: Normal range of motion.     Mental Status: Normal mood and affect. Normal behavior. Normal judgment and thought content.   Assessment   36 y.o.  G2P1001 at [redacted]w[redacted]d by  11/18/2017, by Last Menstrual Period presenting for routine prenatal visit  Plan   pregnancy #2 Problems (from 03/17/17 to present)    Problem Noted Resolved   Supervision of elderly multigravida in first trimester 04/27/2017 by Will Bonnet, MD No   Overview Signed 06/23/2017  9:30 PM by Dalia Heading, [facility]  Prenatal Labs  Dating LMP Blood type: O/Positive/-- (06/27 1205)   Genetic Screen 1 Screen:    AFP:     Quad:     NIPS: Antibody:Negative (06/27 1205)  Anatomic [place]  Rubella Immune; Varicella Immune  GTT Early:               Third trimester:  RPR: Non Reactive (06/27 1205)   Flu vaccine  HBsAg: Negative (06/27 1205)   TDaP vaccine                                               Rhogam: HIV:   negative  Baby Food                                               GBS: (For PCN allergy, check sensitivities)  Contraception  Pap:  Circumcision    Pediatrician    Support Person         Encounter for supervision of high risk multigravida of advanced maternal age, antepartum 03/23/2017 by Dalia Heading, CNM No   Overview Addendum 05/28/2017  4:00 PM by Will Bonnet, MD     Clinic [facility] Prenatal Labs  Dating  LMP = 10 week u/s Blood type: O/Positive/-- (06/27 1205) O POS  Genetic Screen 1 Screen:    AFP:     Quad:     NIPS: Antibody:Negative (06/27 1205)neg  Anatomic [place]  Rubella: 30.50 (06/27 1205)Immune  GTT Early:   88            Third trimester:  RPR: Non Reactive (06/27 1205) non reactive  Flu vaccine  HBsAg: Negative (06/27 1205) negative  TDaP vaccine                                               Rhogam: HIV:   non reactive  Baby Food                                               GBS: (For PCN allergy, check sensitivities)  Contraception  Pap: NIL 03/17/17  Circumcision  Varicella: Immune  Pediatrician  GC/ Chlamydia N/N  Support Person  P/C ratio 221 mgm at NOB/       Hypertension in pregnancy, antepartum 03/23/2017 by Dalia Heading,  CNM No   Overview Addendum 05/28/2017  3:57 PM by Will Bonnet, MD    [x]  Aspirin 81 mg daily after 12 weeks; discontinue after 36 weeks [x]  baseline labs with CBC, CMP, urine protein/creatinine ratio (211) - normal [ ]  no BP meds unless BPs become elevated [ ]  ultrasound for growth at 28, 32, 36 weeks [x]  Aspirin 81 mg daily after 12 weeks; discontinue after 36 weeks [ ]  Baseline EKG   Current antihypertensives:  None   Baseline and surveillance labs (pulled in from [facility], refresh links as needed)  Lab Results  Component Value Date   PLT 324 03/17/2017   CREATININE 0.93 12/12/2009    Antenatal Testing CHTN - O10.919  Group I  BP < 140/90, no preeclampsia, AGA,  nml AFV, +/- meds    Group II BP > 140/90, on meds, no preeclampsia, AGA, nml AFV  20-28-34-38  20-24-28-32-35-38  32//2 x wk  28//BPP wkly then 32//2 x wk  40 no meds; 39 meds  PRN or 37  Pre-eclampsia  GHTN - O13.9/Preeclampsia without severe features  - O14.00   Preeclampsia with severe features - O14.10  Q 3-4wks  Q 2 wks  28//BPP wkly then 32//2 x wk  Inpatient  37  PRN or 34         Obesity in pregnancy 03/23/2017 by Dalia Heading, CNM No   Overview Addendum 05/28/2017  3:59 PM by Will Bonnet, MD    BMI 31. Add folic acid. Early glucola screen (88)         Preterm labor symptoms and general obstetric precautions including but not limited to vaginal bleeding, contractions, leaking of fluid and fetal movement were reviewed in detail with the patient. Please refer to After Visit Summary for other counseling recommendations.   Return in about 4 weeks (around 08/17/2017) for schedule u/s for growth, schedule 28 week labs and routine prenatal.  [name], MD  07/20/2017 4:32 PM

## 2017-08-17 ENCOUNTER — Ambulatory Visit (INDEPENDENT_AMBULATORY_CARE_PROVIDER_SITE_OTHER): Payer: Medicaid Other

## 2017-08-17 ENCOUNTER — Encounter: Payer: Self-pay | Admitting: Obstetrics and Gynecology

## 2017-08-17 ENCOUNTER — Ambulatory Visit: Payer: Medicaid Other

## 2017-08-17 ENCOUNTER — Ambulatory Visit (INDEPENDENT_AMBULATORY_CARE_PROVIDER_SITE_OTHER): Payer: Medicaid Other | Admitting: Obstetrics and Gynecology

## 2017-08-17 VITALS — BP 122/74 | Wt 232.0 lb

## 2017-08-17 DIAGNOSIS — O99210 Obesity complicating pregnancy, unspecified trimester: Secondary | ICD-10-CM | POA: Diagnosis not present

## 2017-08-17 DIAGNOSIS — O10019 Pre-existing essential hypertension complicating pregnancy, unspecified trimester: Secondary | ICD-10-CM

## 2017-08-17 DIAGNOSIS — O09529 Supervision of elderly multigravida, unspecified trimester: Secondary | ICD-10-CM | POA: Diagnosis not present

## 2017-08-17 DIAGNOSIS — O09523 Supervision of elderly multigravida, third trimester: Secondary | ICD-10-CM

## 2017-08-17 DIAGNOSIS — Z131 Encounter for screening for diabetes mellitus: Secondary | ICD-10-CM

## 2017-08-17 DIAGNOSIS — O43112 Circumvallate placenta, second trimester: Secondary | ICD-10-CM

## 2017-08-17 DIAGNOSIS — Z23 Encounter for immunization: Secondary | ICD-10-CM | POA: Diagnosis not present

## 2017-08-17 DIAGNOSIS — Z113 Encounter for screening for infections with a predominantly sexual mode of transmission: Secondary | ICD-10-CM

## 2017-08-17 DIAGNOSIS — Z3A26 26 weeks gestation of pregnancy: Secondary | ICD-10-CM

## 2017-08-17 NOTE — Progress Notes (Signed)
Routine Prenatal Care Visit  Subjective  Erika Figueroa is a 36 y.o. G2P1001 at [redacted]w[redacted]d being seen today for ongoing prenatal care.  She is currently monitored for the following issues for this high-risk pregnancy and has OVERWEIGHT; HYPERTENSION; ALLERGIC RHINITIS; VAGINITIS; FATIGUE; Encounter for supervision of high risk multigravida of advanced maternal age, antepartum; Hypertension in pregnancy, antepartum; Obesity in pregnancy; Supervision of elderly multigravida; and Circumvallate placenta in second trimester on their problem list.  ----------------------------------------------------------------------------------- Patient reports no complaints.   Contractions: Not present. Vag. Bleeding: None.  Movement: Present. Denies leaking of fluid.  Growth u/s 54th %ile, afi 16cm today. 28 wk labs with GTT today.  ----------------------------------------------------------------------------------- The following portions of the patient's history were reviewed and updated as appropriate: allergies, current medications, past family history, past medical history, past social history, past surgical history and problem list. Problem list updated.   Objective  Blood pressure 122/74, weight 232 lb (105.2 kg), last menstrual period 02/11/2017. Pregravid weight 234 lb (106.1 kg) Total Weight Gain  (-0.907 kg) Urinalysis: Urine Protein: Negative Urine Glucose: Negative  Fetal Status: Fetal Heart Rate (bpm): Present   Movement: Present     General:  Alert, oriented and cooperative. Patient is in no acute distress.  Skin: Skin is warm and dry. No rash noted.   Cardiovascular: Normal heart rate noted  Respiratory: Normal respiratory effort, no problems with respiration noted  Abdomen: Soft, gravid, appropriate for gestational age. Pain/Pressure: Absent     Pelvic:  Cervical exam deferred        Extremities: Normal range of motion.     Mental Status: Normal mood and affect. Normal behavior. Normal judgment  and thought content.   Assessment   36 y.o. G2P1001 at [redacted]w[redacted]d by  11/18/2017, by Last Menstrual Period presenting for routine prenatal visit  Plan   pregnancy #2 Problems (from 03/17/17 to present)    Problem Noted Resolved   Supervision of elderly multigravida 04/27/2017 by Will Bonnet, MD No   Overview Signed 06/23/2017  9:30 PM by Dalia Heading, [facility]  Prenatal Labs  Dating LMP Blood type: O/Positive/-- (06/27 1205)   Genetic Screen 1 Screen:    AFP:     Quad:     NIPS: Antibody:Negative (06/27 1205)  Anatomic [place]  Rubella Immune; Varicella Immune  GTT Early:               Third trimester:  RPR: Non Reactive (06/27 1205)   Flu vaccine  HBsAg: Negative (06/27 1205)   TDaP vaccine                                               Rhogam: HIV:   negative  Baby Food                                               GBS: (For PCN allergy, check sensitivities)  Contraception  Pap:  Circumcision    Pediatrician    Support Person          Encounter for supervision of high risk multigravida of advanced maternal age, antepartum 03/23/2017 by Dalia Heading, CNM No   Overview Addendum 05/28/2017  4:00 PM by Will Bonnet, MD  Clinic [facility] Prenatal Labs  Dating LMP = 10 week u/s Blood type: O/Positive/-- (06/27 1205) O POS  Genetic Screen 1 Screen:    AFP:     Quad:     NIPS: Antibody:Negative (06/27 1205)neg  Anatomic [place]  Rubella: 30.50 (06/27 1205)Immune  GTT Early:   88            Third trimester:  RPR: Non Reactive (06/27 1205) non reactive  Flu vaccine  HBsAg: Negative (06/27 1205) negative  TDaP vaccine                                               Rhogam: HIV:   non reactive  Baby Food                                               GBS: (For PCN allergy, check sensitivities)  Contraception  Pap: NIL 03/17/17  Circumcision  Varicella: Immune  Pediatrician  GC/ Chlamydia N/N  Support Person  P/C ratio 221 mgm at NOB/        Hypertension in pregnancy, antepartum  03/23/2017 by Dalia Heading, CNM No   Overview Addendum 05/28/2017  3:57 PM by Will Bonnet, MD    [x]  Aspirin 81 mg daily after 12 weeks; discontinue after 36 weeks [x]  baseline labs with CBC, CMP, urine protein/creatinine ratio (211) - normal [ ]  no BP meds unless BPs become elevated [ ]  ultrasound for growth at 28(x), 32, 36 weeks [x]  Aspirin 81 mg daily after 12 weeks; discontinue after 36 weeks [ ]  Baseline EKG   Current antihypertensives:  None   Baseline and surveillance labs (pulled in from [facility], refresh links as needed)  Lab Results  Component Value Date   PLT 324 03/17/2017   CREATININE 0.93 12/12/2009    Antenatal Testing CHTN - O10.919  Group I  BP < 140/90, no preeclampsia, AGA,  nml AFV, +/- meds    Group II BP > 140/90, on meds, no preeclampsia, AGA, nml AFV  20-28-34-38  20-24-28-32-35-38  32//2 x wk  28//BPP wkly then 32//2 x wk  40 no meds; 39 meds  PRN or 37  Pre-eclampsia  GHTN - O13.9/Preeclampsia without severe features  - O14.00   Preeclampsia with severe features - O14.10  Q 3-4wks  Q 2 wks  28//BPP wkly then 32//2 x wk  Inpatient  37  PRN or 34       Obesity in pregnancy 03/23/2017 by Dalia Heading, CNM No   Overview Addendum 05/28/2017  3:59 PM by Will Bonnet, MD    BMI 31. Add folic acid. Early glucola screen (88)        Preterm labor symptoms and general obstetric precautions including but not limited to vaginal bleeding, contractions, leaking of fluid and fetal movement were reviewed in detail with the patient. Please refer to After Visit Summary for other counseling recommendations.   Return in about 2 weeks (around 08/31/2017) for Routine Prenatal Appointment.  [name], MD  08/17/2017 11:10 AM

## 2017-08-18 LAB — 28 WEEK RH+PANEL
BASOS ABS: 0 10*3/uL (ref 0.0–0.2)
Basos: 0 %
EOS (ABSOLUTE): 0.1 10*3/uL (ref 0.0–0.4)
Eos: 1 %
Gestational Diabetes Screen: 92 mg/dL (ref 65–139)
HIV Screen 4th Generation wRfx: NONREACTIVE
Hematocrit: 38 % (ref 34.0–46.6)
Hemoglobin: 12.6 g/dL (ref 11.1–15.9)
IMMATURE GRANS (ABS): 0 10*3/uL (ref 0.0–0.1)
IMMATURE GRANULOCYTES: 0 %
LYMPHS: 18 %
Lymphocytes Absolute: 1.9 10*3/uL (ref 0.7–3.1)
MCH: 30.6 pg (ref 26.6–33.0)
MCHC: 33.2 g/dL (ref 31.5–35.7)
MCV: 92 fL (ref 79–97)
Monocytes Absolute: 0.5 10*3/uL (ref 0.1–0.9)
Monocytes: 4 %
NEUTROS PCT: 77 %
Neutrophils Absolute: 8.5 10*3/uL — ABNORMAL HIGH (ref 1.4–7.0)
PLATELETS: 316 10*3/uL (ref 150–379)
RBC: 4.12 x10E6/uL (ref 3.77–5.28)
RDW: 14.5 % (ref 12.3–15.4)
RPR Ser Ql: NONREACTIVE
WBC: 10.9 10*3/uL — ABNORMAL HIGH (ref 3.4–10.8)

## 2017-08-31 ENCOUNTER — Encounter: Payer: Self-pay | Admitting: Advanced Practice Midwife

## 2017-08-31 ENCOUNTER — Encounter: Payer: Medicaid Other | Admitting: Obstetrics and Gynecology

## 2017-08-31 ENCOUNTER — Ambulatory Visit (INDEPENDENT_AMBULATORY_CARE_PROVIDER_SITE_OTHER): Payer: Medicaid Other | Admitting: Advanced Practice Midwife

## 2017-08-31 VITALS — BP 110/68 | Wt 234.0 lb

## 2017-08-31 DIAGNOSIS — Z3A28 28 weeks gestation of pregnancy: Secondary | ICD-10-CM

## 2017-08-31 NOTE — Patient Instructions (Signed)
Third Trimester of Pregnancy The third trimester is from week 28 through week 40 (months 7 through 9). The third trimester is a time when the unborn baby (fetus) is growing rapidly. At the end of the ninth month, the fetus is about 20 inches in length and weighs 6-10 pounds. Body changes during your third trimester Your body will continue to go through many changes during pregnancy. The changes vary from woman to woman. During the third trimester:  Your weight will continue to increase. You can expect to gain 25-35 pounds (11-16 kg) by the end of the pregnancy.  You may begin to get stretch marks on your hips, abdomen, and breasts.  You may urinate more often because the fetus is moving lower into your pelvis and pressing on your bladder.  You may develop or continue to have heartburn. This is caused by increased hormones that slow down muscles in the digestive tract.  You may develop or continue to have constipation because increased hormones slow digestion and cause the muscles that push waste through your intestines to relax.  You may develop hemorrhoids. These are swollen veins (varicose veins) in the rectum that can itch or be painful.  You may develop swollen, bulging veins (varicose veins) in your legs.  You may have increased body aches in the pelvis, back, or thighs. This is due to weight gain and increased hormones that are relaxing your joints.  You may have changes in your hair. These can include thickening of your hair, rapid growth, and changes in texture. Some women also have hair loss during or after pregnancy, or hair that feels dry or thin. Your hair will most likely return to normal after your baby is born.  Your breasts will continue to grow and they will continue to become tender. A yellow fluid (colostrum) may leak from your breasts. This is the first milk you are producing for your baby.  Your belly button may stick out.  You may notice more swelling in your hands,  face, or ankles.  You may have increased tingling or numbness in your hands, arms, and legs. The skin on your belly may also feel numb.  You may feel short of breath because of your expanding uterus.  You may have more problems sleeping. This can be caused by the size of your belly, increased need to urinate, and an increase in your body's metabolism.  You may notice the fetus "dropping," or moving lower in your abdomen (lightening).  You may have increased vaginal discharge.  You may notice your joints feel loose and you may have pain around your pelvic bone.  What to expect at prenatal visits You will have prenatal exams every 2 weeks until week 36. Then you will have weekly prenatal exams. During a routine prenatal visit:  You will be weighed to make sure you and the baby are growing normally.  Your blood pressure will be taken.  Your abdomen will be measured to track your baby's growth.  The fetal heartbeat will be listened to.  Any test results from the previous visit will be discussed.  You may have a cervical check near your due date to see if your cervix has softened or thinned (effaced).  You will be tested for Group B streptococcus. This happens between 35 and 37 weeks.  Your health care provider may ask you:  What your birth plan is.  How you are feeling.  If you are feeling the baby move.  If you have had   any abnormal symptoms, such as leaking fluid, bleeding, severe headaches, or abdominal cramping.  If you are using any tobacco products, including cigarettes, chewing tobacco, and electronic cigarettes.  If you have any questions.  Other tests or screenings that may be performed during your third trimester include:  Blood tests that check for low iron levels (anemia).  Fetal testing to check the health, activity level, and growth of the fetus. Testing is done if you have certain medical conditions or if there are problems during the  pregnancy.  Nonstress test (NST). This test checks the health of your baby to make sure there are no signs of problems, such as the baby not getting enough oxygen. During this test, a belt is placed around your belly. The baby is made to move, and its heart rate is monitored during movement.  What is false labor? False labor is a condition in which you feel small, irregular tightenings of the muscles in the womb (contractions) that usually go away with rest, changing position, or drinking water. These are called Braxton Hicks contractions. Contractions may last for hours, days, or even weeks before true labor sets in. If contractions come at regular intervals, become more frequent, increase in intensity, or become painful, you should see your health care provider. What are the signs of labor?  Abdominal cramps.  Regular contractions that start at 10 minutes apart and become stronger and more frequent with time.  Contractions that start on the top of the uterus and spread down to the lower abdomen and back.  Increased pelvic pressure and dull back pain.  A watery or bloody mucus discharge that comes from the vagina.  Leaking of amniotic fluid. This is also known as your "water breaking." It could be a slow trickle or a gush. Let your health care provider know if it has a color or strange odor. If you have any of these signs, call your health care provider right away, even if it is before your due date. Follow these instructions at home: Medicines  Follow your health care provider's instructions regarding medicine use. Specific medicines may be either safe or unsafe to take during pregnancy.  Take a prenatal vitamin that contains at least 600 micrograms (mcg) of folic acid.  If you develop constipation, try taking a stool softener if your health care provider approves. Eating and drinking  Eat a balanced diet that includes fresh fruits and vegetables, whole grains, good sources of protein  such as meat, eggs, or tofu, and low-fat dairy. Your health care provider will help you determine the amount of weight gain that is right for you.  Avoid raw meat and uncooked cheese. These carry germs that can cause birth defects in the baby.  If you have low calcium intake from food, talk to your health care provider about whether you should take a daily calcium supplement.  Eat four or five small meals rather than three large meals a day.  Limit foods that are high in fat and processed sugars, such as fried and sweet foods.  To prevent constipation: ? Drink enough fluid to keep your urine clear or pale yellow. ? Eat foods that are high in fiber, such as fresh fruits and vegetables, whole grains, and beans. Activity  Exercise only as directed by your health care provider. Most women can continue their usual exercise routine during pregnancy. Try to exercise for 30 minutes at least 5 days a week. Stop exercising if you experience uterine contractions.  Avoid heavy   lifting.  Do not exercise in extreme heat or humidity, or at high altitudes.  Wear low-heel, comfortable shoes.  Practice good posture.  You may continue to have sex unless your health care provider tells you otherwise. Relieving pain and discomfort  Take frequent breaks and rest with your legs elevated if you have leg cramps or low back pain.  Take warm sitz baths to soothe any pain or discomfort caused by hemorrhoids. Use hemorrhoid cream if your health care provider approves.  Wear a good support bra to prevent discomfort from breast tenderness.  If you develop varicose veins: ? Wear support pantyhose or compression stockings as told by your healthcare provider. ? Elevate your feet for 15 minutes, 3-4 times a day. Prenatal care  Write down your questions. Take them to your prenatal visits.  Keep all your prenatal visits as told by your health care provider. This is important. Safety  Wear your seat belt at  all times when driving.  Make a list of emergency phone numbers, including numbers for family, friends, the hospital, and police and fire departments. General instructions  Avoid cat litter boxes and soil used by cats. These carry germs that can cause birth defects in the baby. If you have a cat, ask someone to clean the litter box for you.  Do not travel far distances unless it is absolutely necessary and only with the approval of your health care provider.  Do not use hot tubs, steam rooms, or saunas.  Do not drink alcohol.  Do not use any products that contain nicotine or tobacco, such as cigarettes and e-cigarettes. If you need help quitting, ask your health care provider.  Do not use any medicinal herbs or unprescribed drugs. These chemicals affect the formation and growth of the baby.  Do not douche or use tampons or scented sanitary pads.  Do not cross your legs for long periods of time.  To prepare for the arrival of your baby: ? Take prenatal classes to understand, practice, and ask questions about labor and delivery. ? Make a trial run to the hospital. ? Visit the hospital and tour the maternity area. ? Arrange for maternity or paternity leave through employers. ? Arrange for family and friends to take care of pets while you are in the hospital. ? Purchase a rear-facing car seat and make sure you know how to install it in your car. ? Pack your hospital bag. ? Prepare the baby's nursery. Make sure to remove all pillows and stuffed animals from the baby's crib to prevent suffocation.  Visit your dentist if you have not gone during your pregnancy. Use a soft toothbrush to brush your teeth and be gentle when you floss. Contact a health care provider if:  You are unsure if you are in labor or if your water has broken.  You become dizzy.  You have mild pelvic cramps, pelvic pressure, or nagging pain in your abdominal area.  You have lower back pain.  You have persistent  nausea, vomiting, or diarrhea.  You have an unusual or bad smelling vaginal discharge.  You have pain when you urinate. Get help right away if:  Your water breaks before 37 weeks.  You have regular contractions less than 5 minutes apart before 37 weeks.  You have a fever.  You are leaking fluid from your vagina.  You have spotting or bleeding from your vagina.  You have severe abdominal pain or cramping.  You have rapid weight loss or weight gain.    You have shortness of breath with chest pain.  You notice sudden or extreme swelling of your face, hands, ankles, feet, or legs.  Your baby makes fewer than 10 movements in 2 hours.  You have severe headaches that do not go away when you take medicine.  You have vision changes. Summary  The third trimester is from week 28 through week 40, months 7 through 9. The third trimester is a time when the unborn baby (fetus) is growing rapidly.  During the third trimester, your discomfort may increase as you and your baby continue to gain weight. You may have abdominal, leg, and back pain, sleeping problems, and an increased need to urinate.  During the third trimester your breasts will keep growing and they will continue to become tender. A yellow fluid (colostrum) may leak from your breasts. This is the first milk you are producing for your baby.  False labor is a condition in which you feel small, irregular tightenings of the muscles in the womb (contractions) that eventually go away. These are called Braxton Hicks contractions. Contractions may last for hours, days, or even weeks before true labor sets in.  Signs of labor can include: abdominal cramps; regular contractions that start at 10 minutes apart and become stronger and more frequent with time; watery or bloody mucus discharge that comes from the vagina; increased pelvic pressure and dull back pain; and leaking of amniotic fluid. This information is not intended to replace advice  given to you by your health care provider. Make sure you discuss any questions you have with your health care provider. Document Released: 09/01/2001 Document Revised: 02/13/2016 Document Reviewed: 11/08/2012 Elsevier Interactive Patient Education  2017 [employer]  

## 2017-08-31 NOTE — Progress Notes (Signed)
Routine Prenatal Care Visit  Subjective  Erika Figueroa is a 36 y.o. G2P1001 at [redacted]w[redacted]d being seen today for ongoing prenatal care.  She is currently monitored for the following issues for this high-risk pregnancy and has OVERWEIGHT; HYPERTENSION; ALLERGIC RHINITIS; VAGINITIS; FATIGUE; Encounter for supervision of high risk multigravida of advanced maternal age, antepartum; Hypertension in pregnancy, antepartum; Obesity in pregnancy; Supervision of elderly multigravida; and Circumvallate placenta in second trimester on their problem list.  ----------------------------------------------------------------------------------- Patient reports no complaints.   Contractions: Not present.  .  Movement: Present. Denies leaking of fluid.  ----------------------------------------------------------------------------------- The following portions of the patient's history were reviewed and updated as appropriate: allergies, current medications, past family history, past medical history, past social history, past surgical history and problem list. Problem list updated.   Objective  Blood pressure 110/68, weight 234 lb (106.1 kg), last menstrual period 02/11/2017. Pregravid weight 234 lb (106.1 kg) Total Weight Gain 0 lb (0 kg) Urinalysis:      Fetal Status: Fetal Heart Rate (bpm): 150 Fundal Height: 29 cm Movement: Present     General:  Alert, oriented and cooperative. Patient is in no acute distress.  Skin: Skin is warm and dry. No rash noted.   Cardiovascular: Normal heart rate noted  Respiratory: Normal respiratory effort, no problems with respiration noted  Abdomen: Soft, gravid, appropriate for gestational age. Pain/Pressure: Absent     Pelvic:  Cervical exam deferred        Extremities: Normal range of motion.     Mental Status: Normal mood and affect. Normal behavior. Normal judgment and thought content.   Assessment   36 y.o. G2P1001 at [redacted]w[redacted]d by  11/18/2017, by Last Menstrual Period presenting  for routine prenatal visit  Plan   pregnancy #2 Problems (from 03/17/17 to present)    Problem Noted Resolved   Supervision of elderly multigravida 04/27/2017 by Will Bonnet, MD No   Overview Signed 06/23/2017  9:30 PM by Dalia Heading, [facility]  Prenatal Labs  Dating LMP Blood type: O/Positive/-- (06/27 1205)   Genetic Screen 1 Screen:    AFP:     Quad:     NIPS: Antibody:Negative (06/27 1205)  Anatomic [place]  Rubella Immune; Varicella Immune  GTT Early:               Third trimester:  RPR: Non Reactive (06/27 1205)   Flu vaccine  HBsAg: Negative (06/27 1205)   TDaP vaccine                                               Rhogam: HIV:   negative  Baby Food                                               GBS: (For PCN allergy, check sensitivities)  Contraception  Pap:  Circumcision    Pediatrician    Support Person              Encounter for supervision of high risk multigravida of advanced maternal age, antepartum 03/23/2017 by Dalia Heading, CNM No   Overview Addendum 05/28/2017  4:00 PM by Will Bonnet, MD     Clinic [facility] Prenatal Labs  Dating LMP =  10 week u/s Blood type: O/Positive/-- (06/27 1205) O POS  Genetic Screen 1 Screen:    AFP:     Quad:     NIPS: Antibody:Negative (06/27 1205)neg  Anatomic [place]  Rubella: 30.50 (06/27 1205)Immune  GTT Early:   88            Third trimester:  RPR: Non Reactive (06/27 1205) non reactive  Flu vaccine  HBsAg: Negative (06/27 1205) negative  TDaP vaccine                                               Rhogam: HIV:   non reactive  Baby Food                                               GBS: (For PCN allergy, check sensitivities)  Contraception  Pap: NIL 03/17/17  Circumcision  Varicella: Immune  Pediatrician  GC/ Chlamydia N/N  Support Person  P/C ratio 221 mgm at NOB/           Hypertension in pregnancy, antepartum 03/23/2017 by Dalia Heading, CNM No   Overview Addendum 05/28/2017  3:57 PM by Will Bonnet,  MD    [x]  Aspirin 81 mg daily after 12 weeks; discontinue after 36 weeks [x]  baseline labs with CBC, CMP, urine protein/creatinine ratio (211) - normal [ ]  no BP meds unless BPs become elevated [ ]  ultrasound for growth at 28, 32, 36 weeks [x]  Aspirin 81 mg daily after 12 weeks; discontinue after 36 weeks [ ]  Baseline EKG   Current antihypertensives:  None   Baseline and surveillance labs (pulled in from [facility], refresh links as needed)  Lab Results  Component Value Date   PLT 324 03/17/2017   CREATININE 0.93 12/12/2009    Antenatal Testing CHTN - O10.919  Group I  BP < 140/90, no preeclampsia, AGA,  nml AFV, +/- meds    Group II BP > 140/90, on meds, no preeclampsia, AGA, nml AFV  20-28-34-38  20-24-28-32-35-38  32//2 x wk  28//BPP wkly then 32//2 x wk  40 no meds; 39 meds  PRN or 37  Pre-eclampsia  GHTN - O13.9/Preeclampsia without severe features  - O14.00   Preeclampsia with severe features - O14.10  Q 3-4wks  Q 2 wks  28//BPP wkly then 32//2 x wk  Inpatient  37  PRN or 34         Obesity in pregnancy 03/23/2017 by Dalia Heading, CNM No   Overview Addendum 05/28/2017  3:59 PM by Will Bonnet, MD    BMI 31. Add folic acid. Early glucola screen (88)          Preterm labor symptoms and general obstetric precautions including but not limited to vaginal bleeding, contractions, leaking of fluid and fetal movement were reviewed in detail with the patient. Please refer to After Visit Summary for other counseling recommendations.   Return in about 2 weeks (around 09/14/2017) for rob.  Rod Can, CNM  08/31/2017 2:56 PM

## 2017-08-31 NOTE — Progress Notes (Signed)
C/o slight headache & intermittent dizziness since last visit. Sinuses have been stopped up. Took OTC Benadryl w/relief.

## 2017-09-15 ENCOUNTER — Ambulatory Visit (INDEPENDENT_AMBULATORY_CARE_PROVIDER_SITE_OTHER): Payer: Medicaid Other | Admitting: Obstetrics & Gynecology

## 2017-09-15 VITALS — BP 110/60 | Wt 235.0 lb

## 2017-09-15 DIAGNOSIS — Z3A30 30 weeks gestation of pregnancy: Secondary | ICD-10-CM | POA: Diagnosis not present

## 2017-09-15 DIAGNOSIS — O09529 Supervision of elderly multigravida, unspecified trimester: Secondary | ICD-10-CM

## 2017-09-15 DIAGNOSIS — O10019 Pre-existing essential hypertension complicating pregnancy, unspecified trimester: Secondary | ICD-10-CM

## 2017-09-15 DIAGNOSIS — O99210 Obesity complicating pregnancy, unspecified trimester: Secondary | ICD-10-CM

## 2017-09-15 MED ORDER — CONCEPT DHA 53.5-38-1 MG PO CAPS: 1.0000 | ORAL_CAPSULE | Freq: Every day | ORAL | 11 refills | Status: AC

## 2017-09-15 MED ORDER — TETANUS-DIPHTH-ACELL PERTUSSIS 5-2.5-18.5 LF-MCG/0.5 IM SUSP
0.5000 mL | Freq: Once | INTRAMUSCULAR | Status: AC
  Administered 2017-09-15: 0.5 mL via INTRAMUSCULAR

## 2017-09-15 NOTE — Patient Instructions (Signed)
Third Trimester of Pregnancy The third trimester is from week 28 through week 40 (months 7 through 9). The third trimester is a time when the unborn baby (fetus) is growing rapidly. At the end of the ninth month, the fetus is about 20 inches in length and weighs 6-10 pounds. Body changes during your third trimester Your body will continue to go through many changes during pregnancy. The changes vary from woman to woman. During the third trimester:  Your weight will continue to increase. You can expect to gain 25-35 pounds (11-16 kg) by the end of the pregnancy.  You may begin to get stretch marks on your hips, abdomen, and breasts.  You may urinate more often because the fetus is moving lower into your pelvis and pressing on your bladder.  You may develop or continue to have heartburn. This is caused by increased hormones that slow down muscles in the digestive tract.  You may develop or continue to have constipation because increased hormones slow digestion and cause the muscles that push waste through your intestines to relax.  You may develop hemorrhoids. These are swollen veins (varicose veins) in the rectum that can itch or be painful.  You may develop swollen, bulging veins (varicose veins) in your legs.  You may have increased body aches in the pelvis, back, or thighs. This is due to weight gain and increased hormones that are relaxing your joints.  You may have changes in your hair. These can include thickening of your hair, rapid growth, and changes in texture. Some women also have hair loss during or after pregnancy, or hair that feels dry or thin. Your hair will most likely return to normal after your baby is born.  Your breasts will continue to grow and they will continue to become tender. A yellow fluid (colostrum) may leak from your breasts. This is the first milk you are producing for your baby.  Your belly button may stick out.  You may notice more swelling in your hands,  face, or ankles.  You may have increased tingling or numbness in your hands, arms, and legs. The skin on your belly may also feel numb.  You may feel short of breath because of your expanding uterus.  You may have more problems sleeping. This can be caused by the size of your belly, increased need to urinate, and an increase in your body's metabolism.  You may notice the fetus "dropping," or moving lower in your abdomen (lightening).  You may have increased vaginal discharge.  You may notice your joints feel loose and you may have pain around your pelvic bone.  What to expect at prenatal visits You will have prenatal exams every 2 weeks until week 36. Then you will have weekly prenatal exams. During a routine prenatal visit:  You will be weighed to make sure you and the baby are growing normally.  Your blood pressure will be taken.  Your abdomen will be measured to track your baby's growth.  The fetal heartbeat will be listened to.  Any test results from the previous visit will be discussed.  You may have a cervical check near your due date to see if your cervix has softened or thinned (effaced).  You will be tested for Group B streptococcus. This happens between 35 and 37 weeks.  Your health care provider may ask you:  What your birth plan is.  How you are feeling.  If you are feeling the baby move.  If you have had   any abnormal symptoms, such as leaking fluid, bleeding, severe headaches, or abdominal cramping.  If you are using any tobacco products, including cigarettes, chewing tobacco, and electronic cigarettes.  If you have any questions.  Other tests or screenings that may be performed during your third trimester include:  Blood tests that check for low iron levels (anemia).  Fetal testing to check the health, activity level, and growth of the fetus. Testing is done if you have certain medical conditions or if there are problems during the  pregnancy.  Nonstress test (NST). This test checks the health of your baby to make sure there are no signs of problems, such as the baby not getting enough oxygen. During this test, a belt is placed around your belly. The baby is made to move, and its heart rate is monitored during movement.  What is false labor? False labor is a condition in which you feel small, irregular tightenings of the muscles in the womb (contractions) that usually go away with rest, changing position, or drinking water. These are called Braxton Hicks contractions. Contractions may last for hours, days, or even weeks before true labor sets in. If contractions come at regular intervals, become more frequent, increase in intensity, or become painful, you should see your health care provider. What are the signs of labor?  Abdominal cramps.  Regular contractions that start at 10 minutes apart and become stronger and more frequent with time.  Contractions that start on the top of the uterus and spread down to the lower abdomen and back.  Increased pelvic pressure and dull back pain.  A watery or bloody mucus discharge that comes from the vagina.  Leaking of amniotic fluid. This is also known as your "water breaking." It could be a slow trickle or a gush. Let your health care provider know if it has a color or strange odor. If you have any of these signs, call your health care provider right away, even if it is before your due date. Follow these instructions at home: Medicines  Follow your health care provider's instructions regarding medicine use. Specific medicines may be either safe or unsafe to take during pregnancy.  Take a prenatal vitamin that contains at least 600 micrograms (mcg) of folic acid.  If you develop constipation, try taking a stool softener if your health care provider approves. Eating and drinking  Eat a balanced diet that includes fresh fruits and vegetables, whole grains, good sources of protein  such as meat, eggs, or tofu, and low-fat dairy. Your health care provider will help you determine the amount of weight gain that is right for you.  Avoid raw meat and uncooked cheese. These carry germs that can cause birth defects in the baby.  If you have low calcium intake from food, talk to your health care provider about whether you should take a daily calcium supplement.  Eat four or five small meals rather than three large meals a day.  Limit foods that are high in fat and processed sugars, such as fried and sweet foods.  To prevent constipation: ? Drink enough fluid to keep your urine clear or pale yellow. ? Eat foods that are high in fiber, such as fresh fruits and vegetables, whole grains, and beans. Activity  Exercise only as directed by your health care provider. Most women can continue their usual exercise routine during pregnancy. Try to exercise for 30 minutes at least 5 days a week. Stop exercising if you experience uterine contractions.  Avoid heavy   lifting.  Do not exercise in extreme heat or humidity, or at high altitudes.  Wear low-heel, comfortable shoes.  Practice good posture.  You may continue to have sex unless your health care provider tells you otherwise. Relieving pain and discomfort  Take frequent breaks and rest with your legs elevated if you have leg cramps or low back pain.  Take warm sitz baths to soothe any pain or discomfort caused by hemorrhoids. Use hemorrhoid cream if your health care provider approves.  Wear a good support bra to prevent discomfort from breast tenderness.  If you develop varicose veins: ? Wear support pantyhose or compression stockings as told by your healthcare provider. ? Elevate your feet for 15 minutes, 3-4 times a day. Prenatal care  Write down your questions. Take them to your prenatal visits.  Keep all your prenatal visits as told by your health care provider. This is important. Safety  Wear your seat belt at  all times when driving.  Make a list of emergency phone numbers, including numbers for family, friends, the hospital, and police and fire departments. General instructions  Avoid cat litter boxes and soil used by cats. These carry germs that can cause birth defects in the baby. If you have a cat, ask someone to clean the litter box for you.  Do not travel far distances unless it is absolutely necessary and only with the approval of your health care provider.  Do not use hot tubs, steam rooms, or saunas.  Do not drink alcohol.  Do not use any products that contain nicotine or tobacco, such as cigarettes and e-cigarettes. If you need help quitting, ask your health care provider.  Do not use any medicinal herbs or unprescribed drugs. These chemicals affect the formation and growth of the baby.  Do not douche or use tampons or scented sanitary pads.  Do not cross your legs for long periods of time.  To prepare for the arrival of your baby: ? Take prenatal classes to understand, practice, and ask questions about labor and delivery. ? Make a trial run to the hospital. ? Visit the hospital and tour the maternity area. ? Arrange for maternity or paternity leave through employers. ? Arrange for family and friends to take care of pets while you are in the hospital. ? Purchase a rear-facing car seat and make sure you know how to install it in your car. ? Pack your hospital bag. ? Prepare the baby's nursery. Make sure to remove all pillows and stuffed animals from the baby's crib to prevent suffocation.  Visit your dentist if you have not gone during your pregnancy. Use a soft toothbrush to brush your teeth and be gentle when you floss. Contact a health care provider if:  You are unsure if you are in labor or if your water has broken.  You become dizzy.  You have mild pelvic cramps, pelvic pressure, or nagging pain in your abdominal area.  You have lower back pain.  You have persistent  nausea, vomiting, or diarrhea.  You have an unusual or bad smelling vaginal discharge.  You have pain when you urinate. Get help right away if:  Your water breaks before 37 weeks.  You have regular contractions less than 5 minutes apart before 37 weeks.  You have a fever.  You are leaking fluid from your vagina.  You have spotting or bleeding from your vagina.  You have severe abdominal pain or cramping.  You have rapid weight loss or weight gain.    You have shortness of breath with chest pain.  You notice sudden or extreme swelling of your face, hands, ankles, feet, or legs.  Your baby makes fewer than 10 movements in 2 hours.  You have severe headaches that do not go away when you take medicine.  You have vision changes. Summary  The third trimester is from week 28 through week 40, months 7 through 9. The third trimester is a time when the unborn baby (fetus) is growing rapidly.  During the third trimester, your discomfort may increase as you and your baby continue to gain weight. You may have abdominal, leg, and back pain, sleeping problems, and an increased need to urinate.  During the third trimester your breasts will keep growing and they will continue to become tender. A yellow fluid (colostrum) may leak from your breasts. This is the first milk you are producing for your baby.  False labor is a condition in which you feel small, irregular tightenings of the muscles in the womb (contractions) that eventually go away. These are called Braxton Hicks contractions. Contractions may last for hours, days, or even weeks before true labor sets in.  Signs of labor can include: abdominal cramps; regular contractions that start at 10 minutes apart and become stronger and more frequent with time; watery or bloody mucus discharge that comes from the vagina; increased pelvic pressure and dull back pain; and leaking of amniotic fluid. This information is not intended to replace advice  given to you by your health care provider. Make sure you discuss any questions you have with your health care provider. Document Released: 09/01/2001 Document Revised: 02/13/2016 Document Reviewed: 11/08/2012 Elsevier Interactive Patient Education  2017 [employer]  

## 2017-09-15 NOTE — Addendum Note (Signed)
Addended by: Quintella Baton D on: 09/15/2017 11:50 AM   Modules accepted: Orders

## 2017-09-15 NOTE — Progress Notes (Signed)
  Subjective  Fetal Movement? yes Contractions? no Leaking Fluid? no Vaginal Bleeding? no  Objective  BP 110/60   Wt 235 lb (106.6 kg)   LMP 02/11/2017 (Exact Date)   BMI 31.87 kg/m  General: NAD Pumonary: no increased work of breathing Abdomen: gravid, non-tender Extremities: no edema Psychiatric: mood appropriate, affect full  Assessment  36 y.o. G2P1001 at [redacted]w[redacted]d by  11/18/2017, by Last Menstrual Period presenting for routine prenatal visit  Plan   Problem List Items Addressed This Visit      Cardiovascular and Mediastinum   Hypertension in pregnancy, antepartum   Relevant Orders   US OB Follow Up     Other   Encounter for supervision of high risk multigravida of advanced maternal age, antepartum   Obesity in pregnancy   Relevant Orders   US OB Follow Up    Other Visit Diagnoses    [redacted] weeks gestation of pregnancy    -  Primary    NST AFI Growth [place] 32 weeks TDaP today Plans to breast feed PNV changed to Cocept Clifford today Zantac for GERD sx's  Barnett Applebaum, MD, [facility], [facility] 09/15/2017  11:44 AM

## 2017-09-21 NOTE — L&D Delivery Note (Signed)
Delivery Note Primary OB: [facility] Delivery Physician: Barnett Applebaum, MD Gestational Age: Full term Antepartum complications: Macrosomia suspected Intrapartum complications: Meconium  A viable Female was delivered via vertex perentation.  Apgars:8 ,9  Weight:  7 lb 11 oz .   Placenta status: spontaneous and Intact.  Cord: 3+ vessels;  with the following complications: nuchal and meconium stained membranes.  Anesthesia:  epidural Episiotomy:  none Lacerations:  2nd Suture Repair: 2.0 vicryl Est. Blood Loss (mL):  300 mL  Mom to postpartum.  Baby to Couplet care / Skin to Skin.  Barnett Applebaum, MD, [facility], [facility] 11/13/2017  12:05 AM [phone]

## 2017-09-29 ENCOUNTER — Ambulatory Visit (INDEPENDENT_AMBULATORY_CARE_PROVIDER_SITE_OTHER): Payer: Medicaid Other | Admitting: Obstetrics & Gynecology

## 2017-09-29 ENCOUNTER — Ambulatory Visit (INDEPENDENT_AMBULATORY_CARE_PROVIDER_SITE_OTHER): Payer: Medicaid Other

## 2017-09-29 VITALS — BP 128/72 | Wt 236.0 lb

## 2017-09-29 DIAGNOSIS — O09529 Supervision of elderly multigravida, unspecified trimester: Secondary | ICD-10-CM

## 2017-09-29 DIAGNOSIS — O10019 Pre-existing essential hypertension complicating pregnancy, unspecified trimester: Secondary | ICD-10-CM

## 2017-09-29 DIAGNOSIS — O99210 Obesity complicating pregnancy, unspecified trimester: Secondary | ICD-10-CM | POA: Diagnosis not present

## 2017-09-29 DIAGNOSIS — Z3A32 32 weeks gestation of pregnancy: Secondary | ICD-10-CM

## 2017-09-29 LAB — FETAL NONSTRESS TEST

## 2017-09-29 NOTE — Progress Notes (Signed)
Pt reports No Complaints. Review of ULTRASOUND.    I have personally reviewed images and report of recent ultrasound done at [facility].    Plan of management to be discussed with patient.  A NST procedure was performed with FHR monitoring and a normal baseline established, appropriate time of 20-40 minutes of evaluation, and accels >2 seen w 15x15 characteristics.  Results show a REACTIVE NST.   [ x] Aspirin 81 mg daily after 12 weeks; discontinue after 36 weeks [ x] baseline labs with CBC, CMP, urine protein/creatinine ratio [ x] no BP meds unless BPs become elevated [ x] ultrasound for growth at 28, 32, 36 weeks   Current antihypertensives:  None   Baseline and surveillance labs (pulled in from [facility], refresh links as needed)  Lab Results  Component Value Date   PLT 316 08/17/2017   CREATININE 0.65 05/26/2017   AST 20 05/26/2017   ALT 14 05/26/2017    Antenatal Testing CHTN - O10.919  - - - - Group I  BP < 140/90, no preeclampsia, AGA,  nml AFV, +/- meds    Group II BP > 140/90, on meds, no preeclampsia, AGA, nml AFV  20-28-34-38  20-24-28-32-35-38  32//2 x wk  28//BPP wkly then 32//2 x wk  40 no meds; 39 meds  PRN or 37  Pre-eclampsia  GHTN - O13.9/Preeclampsia without severe features  - O14.00   Preeclampsia with severe features - O14.10  Q 3-4wks  Q 2 wks  28//BPP wkly then 32//2 x wk  Inpatient  37  PRN or 34   Plan- monitor sx's, as well as growth NST weekly, AFI weekly IOL 40 weeks unless sx's change Macrosomia risks and management discussed  Barnett Applebaum, MD, [facility], [facility] 09/29/2017  10:25 AM

## 2017-09-29 NOTE — Addendum Note (Signed)
Addended by: Gae Dry on: 09/29/2017 11:02 AM   Modules accepted: Orders

## 2017-09-29 NOTE — Patient Instructions (Signed)
Third Trimester of Pregnancy The third trimester is from week 28 through week 40 (months 7 through 9). The third trimester is a time when the unborn baby (fetus) is growing rapidly. At the end of the ninth month, the fetus is about 20 inches in length and weighs 6-10 pounds. Body changes during your third trimester Your body will continue to go through many changes during pregnancy. The changes vary from woman to woman. During the third trimester:  Your weight will continue to increase. You can expect to gain 25-35 pounds (11-16 kg) by the end of the pregnancy.  You may begin to get stretch marks on your hips, abdomen, and breasts.  You may urinate more often because the fetus is moving lower into your pelvis and pressing on your bladder.  You may develop or continue to have heartburn. This is caused by increased hormones that slow down muscles in the digestive tract.  You may develop or continue to have constipation because increased hormones slow digestion and cause the muscles that push waste through your intestines to relax.  You may develop hemorrhoids. These are swollen veins (varicose veins) in the rectum that can itch or be painful.  You may develop swollen, bulging veins (varicose veins) in your legs.  You may have increased body aches in the pelvis, back, or thighs. This is due to weight gain and increased hormones that are relaxing your joints.  You may have changes in your hair. These can include thickening of your hair, rapid growth, and changes in texture. Some women also have hair loss during or after pregnancy, or hair that feels dry or thin. Your hair will most likely return to normal after your baby is born.  Your breasts will continue to grow and they will continue to become tender. A yellow fluid (colostrum) may leak from your breasts. This is the first milk you are producing for your baby.  Your belly button may stick out.  You may notice more swelling in your hands,  face, or ankles.  You may have increased tingling or numbness in your hands, arms, and legs. The skin on your belly may also feel numb.  You may feel short of breath because of your expanding uterus.  You may have more problems sleeping. This can be caused by the size of your belly, increased need to urinate, and an increase in your body's metabolism.  You may notice the fetus "dropping," or moving lower in your abdomen (lightening).  You may have increased vaginal discharge.  You may notice your joints feel loose and you may have pain around your pelvic bone.  What to expect at prenatal visits You will have prenatal exams every 2 weeks until week 36. Then you will have weekly prenatal exams. During a routine prenatal visit:  You will be weighed to make sure you and the baby are growing normally.  Your blood pressure will be taken.  Your abdomen will be measured to track your baby's growth.  The fetal heartbeat will be listened to.  Any test results from the previous visit will be discussed.  You may have a cervical check near your due date to see if your cervix has softened or thinned (effaced).  You will be tested for Group B streptococcus. This happens between 35 and 37 weeks.  Your health care provider may ask you:  What your birth plan is.  How you are feeling.  If you are feeling the baby move.  If you have had   any abnormal symptoms, such as leaking fluid, bleeding, severe headaches, or abdominal cramping.  If you are using any tobacco products, including cigarettes, chewing tobacco, and electronic cigarettes.  If you have any questions.  Other tests or screenings that may be performed during your third trimester include:  Blood tests that check for low iron levels (anemia).  Fetal testing to check the health, activity level, and growth of the fetus. Testing is done if you have certain medical conditions or if there are problems during the  pregnancy.  Nonstress test (NST). This test checks the health of your baby to make sure there are no signs of problems, such as the baby not getting enough oxygen. During this test, a belt is placed around your belly. The baby is made to move, and its heart rate is monitored during movement.  What is false labor? False labor is a condition in which you feel small, irregular tightenings of the muscles in the womb (contractions) that usually go away with rest, changing position, or drinking water. These are called Braxton Hicks contractions. Contractions may last for hours, days, or even weeks before true labor sets in. If contractions come at regular intervals, become more frequent, increase in intensity, or become painful, you should see your health care provider. What are the signs of labor?  Abdominal cramps.  Regular contractions that start at 10 minutes apart and become stronger and more frequent with time.  Contractions that start on the top of the uterus and spread down to the lower abdomen and back.  Increased pelvic pressure and dull back pain.  A watery or bloody mucus discharge that comes from the vagina.  Leaking of amniotic fluid. This is also known as your "water breaking." It could be a slow trickle or a gush. Let your health care provider know if it has a color or strange odor. If you have any of these signs, call your health care provider right away, even if it is before your due date. Follow these instructions at home: Medicines  Follow your health care provider's instructions regarding medicine use. Specific medicines may be either safe or unsafe to take during pregnancy.  Take a prenatal vitamin that contains at least 600 micrograms (mcg) of folic acid.  If you develop constipation, try taking a stool softener if your health care provider approves. Eating and drinking  Eat a balanced diet that includes fresh fruits and vegetables, whole grains, good sources of protein  such as meat, eggs, or tofu, and low-fat dairy. Your health care provider will help you determine the amount of weight gain that is right for you.  Avoid raw meat and uncooked cheese. These carry germs that can cause birth defects in the baby.  If you have low calcium intake from food, talk to your health care provider about whether you should take a daily calcium supplement.  Eat four or five small meals rather than three large meals a day.  Limit foods that are high in fat and processed sugars, such as fried and sweet foods.  To prevent constipation: ? Drink enough fluid to keep your urine clear or pale yellow. ? Eat foods that are high in fiber, such as fresh fruits and vegetables, whole grains, and beans. Activity  Exercise only as directed by your health care provider. Most women can continue their usual exercise routine during pregnancy. Try to exercise for 30 minutes at least 5 days a week. Stop exercising if you experience uterine contractions.  Avoid heavy   lifting.  Do not exercise in extreme heat or humidity, or at high altitudes.  Wear low-heel, comfortable shoes.  Practice good posture.  You may continue to have sex unless your health care provider tells you otherwise. Relieving pain and discomfort  Take frequent breaks and rest with your legs elevated if you have leg cramps or low back pain.  Take warm sitz baths to soothe any pain or discomfort caused by hemorrhoids. Use hemorrhoid cream if your health care provider approves.  Wear a good support bra to prevent discomfort from breast tenderness.  If you develop varicose veins: ? Wear support pantyhose or compression stockings as told by your healthcare provider. ? Elevate your feet for 15 minutes, 3-4 times a day. Prenatal care  Write down your questions. Take them to your prenatal visits.  Keep all your prenatal visits as told by your health care provider. This is important. Safety  Wear your seat belt at  all times when driving.  Make a list of emergency phone numbers, including numbers for family, friends, the hospital, and police and fire departments. General instructions  Avoid cat litter boxes and soil used by cats. These carry germs that can cause birth defects in the baby. If you have a cat, ask someone to clean the litter box for you.  Do not travel far distances unless it is absolutely necessary and only with the approval of your health care provider.  Do not use hot tubs, steam rooms, or saunas.  Do not drink alcohol.  Do not use any products that contain nicotine or tobacco, such as cigarettes and e-cigarettes. If you need help quitting, ask your health care provider.  Do not use any medicinal herbs or unprescribed drugs. These chemicals affect the formation and growth of the baby.  Do not douche or use tampons or scented sanitary pads.  Do not cross your legs for long periods of time.  To prepare for the arrival of your baby: ? Take prenatal classes to understand, practice, and ask questions about labor and delivery. ? Make a trial run to the hospital. ? Visit the hospital and tour the maternity area. ? Arrange for maternity or paternity leave through employers. ? Arrange for family and friends to take care of pets while you are in the hospital. ? Purchase a rear-facing car seat and make sure you know how to install it in your car. ? Pack your hospital bag. ? Prepare the baby's nursery. Make sure to remove all pillows and stuffed animals from the baby's crib to prevent suffocation.  Visit your dentist if you have not gone during your pregnancy. Use a soft toothbrush to brush your teeth and be gentle when you floss. Contact a health care provider if:  You are unsure if you are in labor or if your water has broken.  You become dizzy.  You have mild pelvic cramps, pelvic pressure, or nagging pain in your abdominal area.  You have lower back pain.  You have persistent  nausea, vomiting, or diarrhea.  You have an unusual or bad smelling vaginal discharge.  You have pain when you urinate. Get help right away if:  Your water breaks before 37 weeks.  You have regular contractions less than 5 minutes apart before 37 weeks.  You have a fever.  You are leaking fluid from your vagina.  You have spotting or bleeding from your vagina.  You have severe abdominal pain or cramping.  You have rapid weight loss or weight gain.    You have shortness of breath with chest pain.  You notice sudden or extreme swelling of your face, hands, ankles, feet, or legs.  Your baby makes fewer than 10 movements in 2 hours.  You have severe headaches that do not go away when you take medicine.  You have vision changes. Summary  The third trimester is from week 28 through week 40, months 7 through 9. The third trimester is a time when the unborn baby (fetus) is growing rapidly.  During the third trimester, your discomfort may increase as you and your baby continue to gain weight. You may have abdominal, leg, and back pain, sleeping problems, and an increased need to urinate.  During the third trimester your breasts will keep growing and they will continue to become tender. A yellow fluid (colostrum) may leak from your breasts. This is the first milk you are producing for your baby.  False labor is a condition in which you feel small, irregular tightenings of the muscles in the womb (contractions) that eventually go away. These are called Braxton Hicks contractions. Contractions may last for hours, days, or even weeks before true labor sets in.  Signs of labor can include: abdominal cramps; regular contractions that start at 10 minutes apart and become stronger and more frequent with time; watery or bloody mucus discharge that comes from the vagina; increased pelvic pressure and dull back pain; and leaking of amniotic fluid. This information is not intended to replace advice  given to you by your health care provider. Make sure you discuss any questions you have with your health care provider. Document Released: 09/01/2001 Document Revised: 02/13/2016 Document Reviewed: 11/08/2012 Elsevier Interactive Patient Education  2017 [employer]  

## 2017-09-30 ENCOUNTER — Telehealth: Payer: Self-pay

## 2017-09-30 NOTE — Telephone Encounter (Signed)
FMLA/DISABILITY form for [employer] filled out, signature obtained and given to [place] for processing.

## 2017-10-07 ENCOUNTER — Ambulatory Visit (INDEPENDENT_AMBULATORY_CARE_PROVIDER_SITE_OTHER): Payer: Medicaid Other

## 2017-10-07 ENCOUNTER — Ambulatory Visit (INDEPENDENT_AMBULATORY_CARE_PROVIDER_SITE_OTHER): Payer: Medicaid Other | Admitting: Advanced Practice Midwife

## 2017-10-07 ENCOUNTER — Encounter: Payer: Self-pay | Admitting: Advanced Practice Midwife

## 2017-10-07 VITALS — BP 128/86 | Wt 238.0 lb

## 2017-10-07 DIAGNOSIS — O10019 Pre-existing essential hypertension complicating pregnancy, unspecified trimester: Secondary | ICD-10-CM | POA: Diagnosis not present

## 2017-10-07 DIAGNOSIS — O09529 Supervision of elderly multigravida, unspecified trimester: Secondary | ICD-10-CM

## 2017-10-07 DIAGNOSIS — O139 Gestational [pregnancy-induced] hypertension without significant proteinuria, unspecified trimester: Secondary | ICD-10-CM

## 2017-10-07 DIAGNOSIS — O99210 Obesity complicating pregnancy, unspecified trimester: Secondary | ICD-10-CM | POA: Diagnosis not present

## 2017-10-07 DIAGNOSIS — Z3A34 34 weeks gestation of pregnancy: Secondary | ICD-10-CM

## 2017-10-07 NOTE — Patient Instructions (Signed)
Third Trimester of Pregnancy The third trimester is from week 28 through week 40 (months 7 through 9). The third trimester is a time when the unborn baby (fetus) is growing rapidly. At the end of the ninth month, the fetus is about 20 inches in length and weighs 6-10 pounds. Body changes during your third trimester Your body will continue to go through many changes during pregnancy. The changes vary from woman to woman. During the third trimester:  Your weight will continue to increase. You can expect to gain 25-35 pounds (11-16 kg) by the end of the pregnancy.  You may begin to get stretch marks on your hips, abdomen, and breasts.  You may urinate more often because the fetus is moving lower into your pelvis and pressing on your bladder.  You may develop or continue to have heartburn. This is caused by increased hormones that slow down muscles in the digestive tract.  You may develop or continue to have constipation because increased hormones slow digestion and cause the muscles that push waste through your intestines to relax.  You may develop hemorrhoids. These are swollen veins (varicose veins) in the rectum that can itch or be painful.  You may develop swollen, bulging veins (varicose veins) in your legs.  You may have increased body aches in the pelvis, back, or thighs. This is due to weight gain and increased hormones that are relaxing your joints.  You may have changes in your hair. These can include thickening of your hair, rapid growth, and changes in texture. Some women also have hair loss during or after pregnancy, or hair that feels dry or thin. Your hair will most likely return to normal after your baby is born.  Your breasts will continue to grow and they will continue to become tender. A yellow fluid (colostrum) may leak from your breasts. This is the first milk you are producing for your baby.  Your belly button may stick out.  You may notice more swelling in your hands,  face, or ankles.  You may have increased tingling or numbness in your hands, arms, and legs. The skin on your belly may also feel numb.  You may feel short of breath because of your expanding uterus.  You may have more problems sleeping. This can be caused by the size of your belly, increased need to urinate, and an increase in your body's metabolism.  You may notice the fetus "dropping," or moving lower in your abdomen (lightening).  You may have increased vaginal discharge.  You may notice your joints feel loose and you may have pain around your pelvic bone.  What to expect at prenatal visits You will have prenatal exams every 2 weeks until week 36. Then you will have weekly prenatal exams. During a routine prenatal visit:  You will be weighed to make sure you and the baby are growing normally.  Your blood pressure will be taken.  Your abdomen will be measured to track your baby's growth.  The fetal heartbeat will be listened to.  Any test results from the previous visit will be discussed.  You may have a cervical check near your due date to see if your cervix has softened or thinned (effaced).  You will be tested for Group B streptococcus. This happens between 35 and 37 weeks.  Your health care provider may ask you:  What your birth plan is.  How you are feeling.  If you are feeling the baby move.  If you have had   any abnormal symptoms, such as leaking fluid, bleeding, severe headaches, or abdominal cramping.  If you are using any tobacco products, including cigarettes, chewing tobacco, and electronic cigarettes.  If you have any questions.  Other tests or screenings that may be performed during your third trimester include:  Blood tests that check for low iron levels (anemia).  Fetal testing to check the health, activity level, and growth of the fetus. Testing is done if you have certain medical conditions or if there are problems during the  pregnancy.  Nonstress test (NST). This test checks the health of your baby to make sure there are no signs of problems, such as the baby not getting enough oxygen. During this test, a belt is placed around your belly. The baby is made to move, and its heart rate is monitored during movement.  What is false labor? False labor is a condition in which you feel small, irregular tightenings of the muscles in the womb (contractions) that usually go away with rest, changing position, or drinking water. These are called Braxton Hicks contractions. Contractions may last for hours, days, or even weeks before true labor sets in. If contractions come at regular intervals, become more frequent, increase in intensity, or become painful, you should see your health care provider. What are the signs of labor?  Abdominal cramps.  Regular contractions that start at 10 minutes apart and become stronger and more frequent with time.  Contractions that start on the top of the uterus and spread down to the lower abdomen and back.  Increased pelvic pressure and dull back pain.  A watery or bloody mucus discharge that comes from the vagina.  Leaking of amniotic fluid. This is also known as your "water breaking." It could be a slow trickle or a gush. Let your health care provider know if it has a color or strange odor. If you have any of these signs, call your health care provider right away, even if it is before your due date. Follow these instructions at home: Medicines  Follow your health care provider's instructions regarding medicine use. Specific medicines may be either safe or unsafe to take during pregnancy.  Take a prenatal vitamin that contains at least 600 micrograms (mcg) of folic acid.  If you develop constipation, try taking a stool softener if your health care provider approves. Eating and drinking  Eat a balanced diet that includes fresh fruits and vegetables, whole grains, good sources of protein  such as meat, eggs, or tofu, and low-fat dairy. Your health care provider will help you determine the amount of weight gain that is right for you.  Avoid raw meat and uncooked cheese. These carry germs that can cause birth defects in the baby.  If you have low calcium intake from food, talk to your health care provider about whether you should take a daily calcium supplement.  Eat four or five small meals rather than three large meals a day.  Limit foods that are high in fat and processed sugars, such as fried and sweet foods.  To prevent constipation: ? Drink enough fluid to keep your urine clear or pale yellow. ? Eat foods that are high in fiber, such as fresh fruits and vegetables, whole grains, and beans. Activity  Exercise only as directed by your health care provider. Most women can continue their usual exercise routine during pregnancy. Try to exercise for 30 minutes at least 5 days a week. Stop exercising if you experience uterine contractions.  Avoid heavy   lifting.  Do not exercise in extreme heat or humidity, or at high altitudes.  Wear low-heel, comfortable shoes.  Practice good posture.  You may continue to have sex unless your health care provider tells you otherwise. Relieving pain and discomfort  Take frequent breaks and rest with your legs elevated if you have leg cramps or low back pain.  Take warm sitz baths to soothe any pain or discomfort caused by hemorrhoids. Use hemorrhoid cream if your health care provider approves.  Wear a good support bra to prevent discomfort from breast tenderness.  If you develop varicose veins: ? Wear support pantyhose or compression stockings as told by your healthcare provider. ? Elevate your feet for 15 minutes, 3-4 times a day. Prenatal care  Write down your questions. Take them to your prenatal visits.  Keep all your prenatal visits as told by your health care provider. This is important. Safety  Wear your seat belt at  all times when driving.  Make a list of emergency phone numbers, including numbers for family, friends, the hospital, and police and fire departments. General instructions  Avoid cat litter boxes and soil used by cats. These carry germs that can cause birth defects in the baby. If you have a cat, ask someone to clean the litter box for you.  Do not travel far distances unless it is absolutely necessary and only with the approval of your health care provider.  Do not use hot tubs, steam rooms, or saunas.  Do not drink alcohol.  Do not use any products that contain nicotine or tobacco, such as cigarettes and e-cigarettes. If you need help quitting, ask your health care provider.  Do not use any medicinal herbs or unprescribed drugs. These chemicals affect the formation and growth of the baby.  Do not douche or use tampons or scented sanitary pads.  Do not cross your legs for long periods of time.  To prepare for the arrival of your baby: ? Take prenatal classes to understand, practice, and ask questions about labor and delivery. ? Make a trial run to the hospital. ? Visit the hospital and tour the maternity area. ? Arrange for maternity or paternity leave through employers. ? Arrange for family and friends to take care of pets while you are in the hospital. ? Purchase a rear-facing car seat and make sure you know how to install it in your car. ? Pack your hospital bag. ? Prepare the baby's nursery. Make sure to remove all pillows and stuffed animals from the baby's crib to prevent suffocation.  Visit your dentist if you have not gone during your pregnancy. Use a soft toothbrush to brush your teeth and be gentle when you floss. Contact a health care provider if:  You are unsure if you are in labor or if your water has broken.  You become dizzy.  You have mild pelvic cramps, pelvic pressure, or nagging pain in your abdominal area.  You have lower back pain.  You have persistent  nausea, vomiting, or diarrhea.  You have an unusual or bad smelling vaginal discharge.  You have pain when you urinate. Get help right away if:  Your water breaks before 37 weeks.  You have regular contractions less than 5 minutes apart before 37 weeks.  You have a fever.  You are leaking fluid from your vagina.  You have spotting or bleeding from your vagina.  You have severe abdominal pain or cramping.  You have rapid weight loss or weight gain.    You have shortness of breath with chest pain.  You notice sudden or extreme swelling of your face, hands, ankles, feet, or legs.  Your baby makes fewer than 10 movements in 2 hours.  You have severe headaches that do not go away when you take medicine.  You have vision changes. Summary  The third trimester is from week 28 through week 40, months 7 through 9. The third trimester is a time when the unborn baby (fetus) is growing rapidly.  During the third trimester, your discomfort may increase as you and your baby continue to gain weight. You may have abdominal, leg, and back pain, sleeping problems, and an increased need to urinate.  During the third trimester your breasts will keep growing and they will continue to become tender. A yellow fluid (colostrum) may leak from your breasts. This is the first milk you are producing for your baby.  False labor is a condition in which you feel small, irregular tightenings of the muscles in the womb (contractions) that eventually go away. These are called Braxton Hicks contractions. Contractions may last for hours, days, or even weeks before true labor sets in.  Signs of labor can include: abdominal cramps; regular contractions that start at 10 minutes apart and become stronger and more frequent with time; watery or bloody mucus discharge that comes from the vagina; increased pelvic pressure and dull back pain; and leaking of amniotic fluid. This information is not intended to replace advice  given to you by your health care provider. Make sure you discuss any questions you have with your health care provider. Document Released: 09/01/2001 Document Revised: 02/13/2016 Document Reviewed: 11/08/2012 Elsevier Interactive Patient Education  2017 [place] Test The nonstress test is a procedure that monitors the fetus's heartbeat. The test will monitor the heartbeat when the fetus is at rest and while the fetus is moving. In a healthy fetus, there will be an increase in fetal heart rate when the fetus moves or kicks. The heart rate will decrease at rest. This test helps determine if the fetus is healthy. Your health care provider will look at a number of patterns in the heart rate tracing to make sure your baby is thriving. If there is concern, your health care provider may order additional tests or may suggest another course of action. This test is often done in the third trimester and can help determine if an early delivery is needed and safe. Common reasons to have this test are:  You are past your due date.  You have a high-risk pregnancy.  You are feeling less movement than normal.  You have lost a pregnancy in the past.  Your health care provider suspects fetal growth problems.  You have too much or too little amniotic fluid.  What happens before the procedure?  Eat a meal right before the test or as directed by your health care provider. Food may help stimulate fetal movements.  Use the restroom right before the test. What happens during the procedure?  Two belts will be placed around your abdomen. These belts have monitors attached to them. One records the fetal heart rate and the other records uterine contractions.  You may be asked to lie down on your side or to stay sitting upright.  You may be given a button to press when you feel movement.  The fetal heartbeat is listened to and watched on a screen. The heartbeat is recorded on a sheet of paper.  If  the fetus  seems to be sleeping, you may be asked to drink some juice or soda, gently press your abdomen, or make some noise to wake the fetus. What happens after the procedure? Your health care provider will discuss the test results with you and make recommendations for the near future.  This information is not intended to replace advice given to you by your health care provider. Make sure you discuss any questions you have with your health care provider. This information is not intended to replace advice given to you by your health care provider. Make sure you discuss any questions you have with your health care provider. Document Released: 08/28/2002 Document Revised: 08/07/2016 Document Reviewed: 10/11/2012 Elsevier Interactive Patient Education  [employer].

## 2017-10-07 NOTE — Progress Notes (Signed)
Routine Prenatal Care Visit  Subjective  Erika Figueroa is a 37 y.o. G2P1001 at [redacted]w[redacted]d being seen today for ongoing prenatal care.  She is currently monitored for the following issues for this high-risk pregnancy and has OVERWEIGHT; HYPERTENSION; ALLERGIC RHINITIS; VAGINITIS; FATIGUE; Encounter for supervision of high risk multigravida of advanced maternal age, antepartum; Hypertension in pregnancy, antepartum; Obesity in pregnancy; Supervision of elderly multigravida; and Circumvallate placenta in second trimester on their problem list.  ----------------------------------------------------------------------------------- Patient reports no complaints.   Contractions: Not present. Vag. Bleeding: None.  Movement: Present. Denies leaking of fluid.  ----------------------------------------------------------------------------------- The following portions of the patient's history were reviewed and updated as appropriate: allergies, current medications, past family history, past medical history, past social history, past surgical history and problem list. Problem list updated.   Objective  Blood pressure 128/86, weight 238 lb (108 kg), last menstrual period 02/11/2017. Pregravid weight 234 lb (106.1 kg) Total Weight Gain 4 lb (1.814 kg) Urinalysis:      Fetal Status: Fetal Heart Rate (bpm): 150   Movement: Present     AFI 11.52 Reactive NST: 20 minute strip with 150 bmp baseline, moderate variability, +accelerations, -decelerations  General:  Alert, oriented and cooperative. Patient is in no acute distress.  Skin: Skin is warm and dry. No rash noted.   Cardiovascular: Normal heart rate noted  Respiratory: Normal respiratory effort, no problems with respiration noted  Abdomen: Soft, gravid, appropriate for gestational age. Pain/Pressure: Absent     Pelvic:  Cervical exam deferred        Extremities: Normal range of motion.  Edema: None  Mental Status: Normal mood and affect. Normal behavior.  Normal judgment and thought content.   Assessment   37 y.o. G2P1001 at [redacted]w[redacted]d by  11/18/2017, by Last Menstrual Period presenting for routine prenatal visit  Plan   pregnancy #2 Problems (from 03/17/17 to present)    Problem Noted Resolved   Supervision of elderly multigravida 04/27/2017 by Will Bonnet, MD No   Overview Signed 06/23/2017  9:30 PM by Dalia Heading, [facility]  Prenatal Labs  Dating LMP Blood type: O/Positive/-- (06/27 1205)   Genetic Screen 1 Screen:    AFP:     Quad:     NIPS: Antibody:Negative (06/27 1205)  Anatomic [place]  Rubella Immune; Varicella Immune  GTT Early:               Third trimester:  RPR: Non Reactive (06/27 1205)   Flu vaccine  HBsAg: Negative (06/27 1205)   TDaP vaccine                                               Rhogam: HIV:   negative  Baby Food                                               GBS: (For PCN allergy, check sensitivities)  Contraception  Pap:  Circumcision    Pediatrician    Support Person              Encounter for supervision of high risk multigravida of advanced maternal age, antepartum 03/23/2017 by Dalia Heading, CNM No   Overview Addendum 05/28/2017  4:00 PM by Glennon Mac,  Estill Bamberg, MD     Clinic [facility] Prenatal Labs  Dating LMP = 10 week u/s Blood type: O/Positive/-- (06/27 1205) O POS  Genetic Screen 1 Screen:    AFP:     Quad:     NIPS: Antibody:Negative (06/27 1205)neg  Anatomic [place]  Rubella: 30.50 (06/27 1205)Immune  GTT Early:   88            Third trimester:  RPR: Non Reactive (06/27 1205) non reactive  Flu vaccine  HBsAg: Negative (06/27 1205) negative  TDaP vaccine                                               Rhogam: HIV:   non reactive  Baby Food                                               GBS: (For PCN allergy, check sensitivities)  Contraception  Pap: NIL 03/17/17  Circumcision  Varicella: Immune  Pediatrician  GC/ Chlamydia N/N  Support Person  P/C ratio 221 mgm at NOB/            Hypertension in pregnancy, antepartum 03/23/2017 by Dalia Heading, CNM No   Overview Addendum 05/28/2017  3:57 PM by Will Bonnet, MD    [x]  Aspirin 81 mg daily after 12 weeks; discontinue after 36 weeks [x]  baseline labs with CBC, CMP, urine protein/creatinine ratio (211) - normal [ ]  no BP meds unless BPs become elevated [ ]  ultrasound for growth at 28, 32, 36 weeks [x]  Aspirin 81 mg daily after 12 weeks; discontinue after 36 weeks [ ]  Baseline EKG   Current antihypertensives:  None   Baseline and surveillance labs (pulled in from [facility], refresh links as needed)  Lab Results  Component Value Date   PLT 324 03/17/2017   CREATININE 0.93 12/12/2009    Antenatal Testing CHTN - O10.919  Group I  BP < 140/90, no preeclampsia, AGA,  nml AFV, +/- meds    Group II BP > 140/90, on meds, no preeclampsia, AGA, nml AFV  20-28-34-38  20-24-28-32-35-38  32//2 x wk  28//BPP wkly then 32//2 x wk  40 no meds; 39 meds  PRN or 37  Pre-eclampsia  GHTN - O13.9/Preeclampsia without severe features  - O14.00   Preeclampsia with severe features - O14.10  Q 3-4wks  Q 2 wks  28//BPP wkly then 32//2 x wk  Inpatient  37  PRN or 34         Obesity in pregnancy 03/23/2017 by Dalia Heading, CNM No   Overview Addendum 05/28/2017  3:59 PM by Will Bonnet, MD    BMI 31. Add folic acid. Early glucola screen (88)          Preterm labor symptoms and general obstetric precautions including but not limited to vaginal bleeding, contractions, leaking of fluid and fetal movement were reviewed in detail with the patient. Please refer to After Visit Summary for other counseling recommendations.   Return in about 1 week (around 10/14/2017) for afi/nst/rob.  Rod Can, CNM  10/07/2017 1:57 PM

## 2017-10-07 NOTE — Progress Notes (Signed)
NST today

## 2017-10-13 ENCOUNTER — Ambulatory Visit (INDEPENDENT_AMBULATORY_CARE_PROVIDER_SITE_OTHER): Payer: Medicaid Other | Admitting: Obstetrics and Gynecology

## 2017-10-13 ENCOUNTER — Other Ambulatory Visit: Payer: Self-pay | Admitting: Obstetrics and Gynecology

## 2017-10-13 ENCOUNTER — Ambulatory Visit (INDEPENDENT_AMBULATORY_CARE_PROVIDER_SITE_OTHER): Payer: Medicaid Other

## 2017-10-13 VITALS — BP 120/70 | Wt 236.0 lb

## 2017-10-13 DIAGNOSIS — O43112 Circumvallate placenta, second trimester: Secondary | ICD-10-CM

## 2017-10-13 DIAGNOSIS — O139 Gestational [pregnancy-induced] hypertension without significant proteinuria, unspecified trimester: Secondary | ICD-10-CM | POA: Diagnosis not present

## 2017-10-13 DIAGNOSIS — Z3A34 34 weeks gestation of pregnancy: Secondary | ICD-10-CM

## 2017-10-13 DIAGNOSIS — O09529 Supervision of elderly multigravida, unspecified trimester: Secondary | ICD-10-CM | POA: Insufficient documentation

## 2017-10-13 DIAGNOSIS — O99210 Obesity complicating pregnancy, unspecified trimester: Secondary | ICD-10-CM

## 2017-10-13 DIAGNOSIS — O09523 Supervision of elderly multigravida, third trimester: Secondary | ICD-10-CM

## 2017-10-13 DIAGNOSIS — O10019 Pre-existing essential hypertension complicating pregnancy, unspecified trimester: Secondary | ICD-10-CM

## 2017-10-13 DIAGNOSIS — O0993 Supervision of high risk pregnancy, unspecified, third trimester: Secondary | ICD-10-CM

## 2017-10-13 LAB — FETAL NONSTRESS TEST

## 2017-10-13 NOTE — Progress Notes (Signed)
Routine Prenatal Care Visit  Subjective  Erika Figueroa is a 37 y.o. G2P1001 at [redacted]w[redacted]d being seen today for ongoing prenatal care.  She is currently monitored for the following issues for this high-risk pregnancy and has OVERWEIGHT; HYPERTENSION; ALLERGIC RHINITIS; VAGINITIS; FATIGUE; Encounter for supervision of high risk multigravida of advanced maternal age, antepartum; Hypertension in pregnancy, antepartum; Obesity in pregnancy; Supervision of elderly multigravida; Circumvallate placenta in second trimester; and Advanced maternal age in multigravida on their problem list.  ----------------------------------------------------------------------------------- Patient reports feeling light headed today possibly from getting up too fast. No other problems. AFI/NST today. .   Contractions: Not present. Vag. Bleeding: None.  Movement: Present. Denies leaking of fluid.  ----------------------------------------------------------------------------------- The following portions of the patient's history were reviewed and updated as appropriate: allergies, current medications, past family history, past medical history, past social history, past surgical history and problem list. Problem list updated.   Objective  Blood pressure 120/70, weight 236 lb (107 kg), last menstrual period 02/11/2017. Pregravid weight 234 lb (106.1 kg) Total Weight Gain 2 lb (0.907 kg) Urinalysis: Urine Protein: Negative Urine Glucose: Negative  Fetal Status: Fetal Heart Rate (bpm): 140 Fundal Height: 35 cm Movement: Present     General:  Alert, oriented and cooperative. Patient is in no acute distress.  Skin: Skin is warm and dry. No rash noted.   Cardiovascular: Normal heart rate noted  Respiratory: Normal respiratory effort, no problems with respiration noted  Abdomen: Soft, gravid, appropriate for gestational age. Pain/Pressure: Absent     Pelvic:  Cervical exam deferred        Extremities: Normal range of motion.      ental Status: Normal mood and affect. Normal behavior. Normal judgment and thought content.     Assessment   37 y.o. G2P1001 at [redacted]w[redacted]d by  11/18/2017, by Last Menstrual Period presenting for routine prenatal visit  Plan   pregnancy #2 Problems (from 03/17/17 to present)    Problem Noted Resolved   Advanced maternal age in multigravida 10/13/2017 by Homero Fellers, MD No   Overview Signed 10/13/2017  9:56 AM by Homero Fellers, MD           Supervision of elderly multigravida 04/27/2017 by Will Bonnet, MD No   Overview Addendum 10/13/2017  9:48 AM by Homero Fellers, MD     Clinic  Prenatal Labs  Dating LMP Blood type: O/Positive/-- (06/27 1205)   Genetic Screen 1 Screen:    AFP:     Quad:     NIPS: Antibody:Negative (06/27 1205)  Anatomic [place]  Rubella Immune; Varicella Immune  GTT Early:               Third trimester:  RPR: Non Reactive (06/27 1205)   Flu vaccine  08/17/2017 HBsAg: Negative (06/27 1205)   TDaP vaccine   09/15/2017                                         HIV:   negative  Baby Food    Breast                                         GBS: (For PCN allergy, check sensitivities)  Contraception  Considering tubal Pap:  Circumcision    Pediatrician  Given Info for CBB  Support Person              Encounter for supervision of high risk multigravida of advanced maternal age, antepartum 03/23/2017 by Dalia Heading, CNM No   Overview Addendum 05/28/2017  4:00 PM by Will Bonnet, MD     Clinic [facility] Prenatal Labs  Dating LMP = 10 week u/s Blood type: O/Positive/-- (06/27 1205) O POS  Genetic Screen 1 Screen:    AFP:     Quad:     NIPS: Antibody:Negative (06/27 1205)neg  Anatomic [place]  Rubella: 30.50 (06/27 1205)Immune  GTT Early:   88            Third trimester:  RPR: Non Reactive (06/27 1205) non reactive  Flu vaccine  HBsAg: Negative (06/27 1205) negative  TDaP vaccine                                               Rhogam: HIV:   non  reactive  Baby Food                                               GBS: (For PCN allergy, check sensitivities)  Contraception  Pap: NIL 03/17/17  Circumcision  Varicella: Immune  Pediatrician  GC/ Chlamydia N/N  Support Person  P/C ratio 221 mgm at NOB/           Hypertension in pregnancy, antepartum 03/23/2017 by Dalia Heading, CNM No   Overview Addendum 05/28/2017  3:57 PM by Will Bonnet, MD    [x]  Aspirin 81 mg daily after 12 weeks; discontinue after 36 weeks [x]  baseline labs with CBC, CMP, urine protein/creatinine ratio (211) - normal [ ]  no BP meds unless BPs become elevated [ ]  ultrasound for growth at 28, 32, 36 weeks [x]  Aspirin 81 mg daily after 12 weeks; discontinue after 36 weeks [ ]  Baseline EKG   Current antihypertensives:  None   Baseline and surveillance labs (pulled in from [facility], refresh links as needed)  Lab Results  Component Value Date   PLT 324 03/17/2017   CREATININE 0.93 12/12/2009    Antenatal Testing CHTN - O10.919  Group I  BP < 140/90, no preeclampsia, AGA,  nml AFV, +/- meds    Group II BP > 140/90, on meds, no preeclampsia, AGA, nml AFV  20-28-34-38  20-24-28-32-35-38  32//2 x wk  28//BPP wkly then 32//2 x wk  40 no meds; 39 meds  PRN or 37  Pre-eclampsia  GHTN - O13.9/Preeclampsia without severe features  - O14.00   Preeclampsia with severe features - O14.10  Q 3-4wks  Q 2 wks  28//BPP wkly then 32//2 x wk  Inpatient  37  PRN or 34         Obesity in pregnancy 03/23/2017 by Dalia Heading, CNM No   Overview Addendum 05/28/2017  3:59 PM by Will Bonnet, MD    BMI 31. Add folic acid. Early glucola screen (88)          Preterm labor symptoms and general obstetric precautions including but not limited to vaginal bleeding, contractions, leaking of fluid and fetal movement were reviewed in detail with the patient. Please refer to After Visit Summary for  other counseling recommendations.   Growth scan in one  week Given book for breast feeding Considering tubal ligation, signed consent today Given information on cord blood banking  Return in about 1 week (around 10/20/2017) for growth [place], ROB.

## 2017-10-20 ENCOUNTER — Ambulatory Visit (INDEPENDENT_AMBULATORY_CARE_PROVIDER_SITE_OTHER): Payer: Medicaid Other

## 2017-10-20 ENCOUNTER — Ambulatory Visit (INDEPENDENT_AMBULATORY_CARE_PROVIDER_SITE_OTHER): Payer: Medicaid Other | Admitting: Obstetrics & Gynecology

## 2017-10-20 VITALS — BP 120/80 | Wt 238.0 lb

## 2017-10-20 DIAGNOSIS — O09523 Supervision of elderly multigravida, third trimester: Secondary | ICD-10-CM

## 2017-10-20 DIAGNOSIS — O0993 Supervision of high risk pregnancy, unspecified, third trimester: Secondary | ICD-10-CM

## 2017-10-20 DIAGNOSIS — Z362 Encounter for other antenatal screening follow-up: Secondary | ICD-10-CM | POA: Diagnosis not present

## 2017-10-20 DIAGNOSIS — O99210 Obesity complicating pregnancy, unspecified trimester: Secondary | ICD-10-CM

## 2017-10-20 DIAGNOSIS — O09529 Supervision of elderly multigravida, unspecified trimester: Secondary | ICD-10-CM

## 2017-10-20 DIAGNOSIS — Z3A35 35 weeks gestation of pregnancy: Secondary | ICD-10-CM

## 2017-10-20 NOTE — Patient Instructions (Signed)
Third Trimester of Pregnancy The third trimester is from week 28 through week 40 (months 7 through 9). The third trimester is a time when the unborn baby (fetus) is growing rapidly. At the end of the ninth month, the fetus is about 20 inches in length and weighs 6-10 pounds. Body changes during your third trimester Your body will continue to go through many changes during pregnancy. The changes vary from woman to woman. During the third trimester:  Your weight will continue to increase. You can expect to gain 25-35 pounds (11-16 kg) by the end of the pregnancy.  You may begin to get stretch marks on your hips, abdomen, and breasts.  You may urinate more often because the fetus is moving lower into your pelvis and pressing on your bladder.  You may develop or continue to have heartburn. This is caused by increased hormones that slow down muscles in the digestive tract.  You may develop or continue to have constipation because increased hormones slow digestion and cause the muscles that push waste through your intestines to relax.  You may develop hemorrhoids. These are swollen veins (varicose veins) in the rectum that can itch or be painful.  You may develop swollen, bulging veins (varicose veins) in your legs.  You may have increased body aches in the pelvis, back, or thighs. This is due to weight gain and increased hormones that are relaxing your joints.  You may have changes in your hair. These can include thickening of your hair, rapid growth, and changes in texture. Some women also have hair loss during or after pregnancy, or hair that feels dry or thin. Your hair will most likely return to normal after your baby is born.  Your breasts will continue to grow and they will continue to become tender. A yellow fluid (colostrum) may leak from your breasts. This is the first milk you are producing for your baby.  Your belly button may stick out.  You may notice more swelling in your hands,  face, or ankles.  You may have increased tingling or numbness in your hands, arms, and legs. The skin on your belly may also feel numb.  You may feel short of breath because of your expanding uterus.  You may have more problems sleeping. This can be caused by the size of your belly, increased need to urinate, and an increase in your body's metabolism.  You may notice the fetus "dropping," or moving lower in your abdomen (lightening).  You may have increased vaginal discharge.  You may notice your joints feel loose and you may have pain around your pelvic bone.  What to expect at prenatal visits You will have prenatal exams every 2 weeks until week 36. Then you will have weekly prenatal exams. During a routine prenatal visit:  You will be weighed to make sure you and the baby are growing normally.  Your blood pressure will be taken.  Your abdomen will be measured to track your baby's growth.  The fetal heartbeat will be listened to.  Any test results from the previous visit will be discussed.  You may have a cervical check near your due date to see if your cervix has softened or thinned (effaced).  You will be tested for Group B streptococcus. This happens between 35 and 37 weeks.  Your health care provider may ask you:  What your birth plan is.  How you are feeling.  If you are feeling the baby move.  If you have had   any abnormal symptoms, such as leaking fluid, bleeding, severe headaches, or abdominal cramping.  If you are using any tobacco products, including cigarettes, chewing tobacco, and electronic cigarettes.  If you have any questions.  Other tests or screenings that may be performed during your third trimester include:  Blood tests that check for low iron levels (anemia).  Fetal testing to check the health, activity level, and growth of the fetus. Testing is done if you have certain medical conditions or if there are problems during the  pregnancy.  Nonstress test (NST). This test checks the health of your baby to make sure there are no signs of problems, such as the baby not getting enough oxygen. During this test, a belt is placed around your belly. The baby is made to move, and its heart rate is monitored during movement.  What is false labor? False labor is a condition in which you feel small, irregular tightenings of the muscles in the womb (contractions) that usually go away with rest, changing position, or drinking water. These are called Braxton Hicks contractions. Contractions may last for hours, days, or even weeks before true labor sets in. If contractions come at regular intervals, become more frequent, increase in intensity, or become painful, you should see your health care provider. What are the signs of labor?  Abdominal cramps.  Regular contractions that start at 10 minutes apart and become stronger and more frequent with time.  Contractions that start on the top of the uterus and spread down to the lower abdomen and back.  Increased pelvic pressure and dull back pain.  A watery or bloody mucus discharge that comes from the vagina.  Leaking of amniotic fluid. This is also known as your "water breaking." It could be a slow trickle or a gush. Let your health care provider know if it has a color or strange odor. If you have any of these signs, call your health care provider right away, even if it is before your due date. Follow these instructions at home: Medicines  Follow your health care provider's instructions regarding medicine use. Specific medicines may be either safe or unsafe to take during pregnancy.  Take a prenatal vitamin that contains at least 600 micrograms (mcg) of folic acid.  If you develop constipation, try taking a stool softener if your health care provider approves. Eating and drinking  Eat a balanced diet that includes fresh fruits and vegetables, whole grains, good sources of protein  such as meat, eggs, or tofu, and low-fat dairy. Your health care provider will help you determine the amount of weight gain that is right for you.  Avoid raw meat and uncooked cheese. These carry germs that can cause birth defects in the baby.  If you have low calcium intake from food, talk to your health care provider about whether you should take a daily calcium supplement.  Eat four or five small meals rather than three large meals a day.  Limit foods that are high in fat and processed sugars, such as fried and sweet foods.  To prevent constipation: ? Drink enough fluid to keep your urine clear or pale yellow. ? Eat foods that are high in fiber, such as fresh fruits and vegetables, whole grains, and beans. Activity  Exercise only as directed by your health care provider. Most women can continue their usual exercise routine during pregnancy. Try to exercise for 30 minutes at least 5 days a week. Stop exercising if you experience uterine contractions.  Avoid heavy   lifting.  Do not exercise in extreme heat or humidity, or at high altitudes.  Wear low-heel, comfortable shoes.  Practice good posture.  You may continue to have sex unless your health care provider tells you otherwise. Relieving pain and discomfort  Take frequent breaks and rest with your legs elevated if you have leg cramps or low back pain.  Take warm sitz baths to soothe any pain or discomfort caused by hemorrhoids. Use hemorrhoid cream if your health care provider approves.  Wear a good support bra to prevent discomfort from breast tenderness.  If you develop varicose veins: ? Wear support pantyhose or compression stockings as told by your healthcare provider. ? Elevate your feet for 15 minutes, 3-4 times a day. Prenatal care  Write down your questions. Take them to your prenatal visits.  Keep all your prenatal visits as told by your health care provider. This is important. Safety  Wear your seat belt at  all times when driving.  Make a list of emergency phone numbers, including numbers for family, friends, the hospital, and police and fire departments. General instructions  Avoid cat litter boxes and soil used by cats. These carry germs that can cause birth defects in the baby. If you have a cat, ask someone to clean the litter box for you.  Do not travel far distances unless it is absolutely necessary and only with the approval of your health care provider.  Do not use hot tubs, steam rooms, or saunas.  Do not drink alcohol.  Do not use any products that contain nicotine or tobacco, such as cigarettes and e-cigarettes. If you need help quitting, ask your health care provider.  Do not use any medicinal herbs or unprescribed drugs. These chemicals affect the formation and growth of the baby.  Do not douche or use tampons or scented sanitary pads.  Do not cross your legs for long periods of time.  To prepare for the arrival of your baby: ? Take prenatal classes to understand, practice, and ask questions about labor and delivery. ? Make a trial run to the hospital. ? Visit the hospital and tour the maternity area. ? Arrange for maternity or paternity leave through employers. ? Arrange for family and friends to take care of pets while you are in the hospital. ? Purchase a rear-facing car seat and make sure you know how to install it in your car. ? Pack your hospital bag. ? Prepare the baby's nursery. Make sure to remove all pillows and stuffed animals from the baby's crib to prevent suffocation.  Visit your dentist if you have not gone during your pregnancy. Use a soft toothbrush to brush your teeth and be gentle when you floss. Contact a health care provider if:  You are unsure if you are in labor or if your water has broken.  You become dizzy.  You have mild pelvic cramps, pelvic pressure, or nagging pain in your abdominal area.  You have lower back pain.  You have persistent  nausea, vomiting, or diarrhea.  You have an unusual or bad smelling vaginal discharge.  You have pain when you urinate. Get help right away if:  Your water breaks before 37 weeks.  You have regular contractions less than 5 minutes apart before 37 weeks.  You have a fever.  You are leaking fluid from your vagina.  You have spotting or bleeding from your vagina.  You have severe abdominal pain or cramping.  You have rapid weight loss or weight gain.    You have shortness of breath with chest pain.  You notice sudden or extreme swelling of your face, hands, ankles, feet, or legs.  Your baby makes fewer than 10 movements in 2 hours.  You have severe headaches that do not go away when you take medicine.  You have vision changes. Summary  The third trimester is from week 28 through week 40, months 7 through 9. The third trimester is a time when the unborn baby (fetus) is growing rapidly.  During the third trimester, your discomfort may increase as you and your baby continue to gain weight. You may have abdominal, leg, and back pain, sleeping problems, and an increased need to urinate.  During the third trimester your breasts will keep growing and they will continue to become tender. A yellow fluid (colostrum) may leak from your breasts. This is the first milk you are producing for your baby.  False labor is a condition in which you feel small, irregular tightenings of the muscles in the womb (contractions) that eventually go away. These are called Braxton Hicks contractions. Contractions may last for hours, days, or even weeks before true labor sets in.  Signs of labor can include: abdominal cramps; regular contractions that start at 10 minutes apart and become stronger and more frequent with time; watery or bloody mucus discharge that comes from the vagina; increased pelvic pressure and dull back pain; and leaking of amniotic fluid. This information is not intended to replace advice  given to you by your health care provider. Make sure you discuss any questions you have with your health care provider. Document Released: 09/01/2001 Document Revised: 02/13/2016 Document Reviewed: 11/08/2012 Elsevier Interactive Patient Education  2017 [employer]  

## 2017-10-20 NOTE — Progress Notes (Signed)
Review of ULTRASOUND.    I have personally reviewed images and report of recent ultrasound done at [facility].    Plan of management to be discussed with patient.    EFW 75%, Vtx, Normal AFI PNV. [place]. GBS done.  PP BTL discussed, desired.  IOL after 39 weeks discussed.  Barnett Applebaum, MD, [facility], [facility] 10/20/2017  10:28 AM

## 2017-10-23 LAB — CULTURE, BETA STREP (GROUP B ONLY): Strep Gp B Culture: POSITIVE — AB

## 2017-11-03 ENCOUNTER — Ambulatory Visit (INDEPENDENT_AMBULATORY_CARE_PROVIDER_SITE_OTHER): Payer: Medicaid Other | Admitting: Obstetrics & Gynecology

## 2017-11-03 VITALS — BP 122/80 | Wt 238.0 lb

## 2017-11-03 DIAGNOSIS — O09529 Supervision of elderly multigravida, unspecified trimester: Secondary | ICD-10-CM

## 2017-11-03 DIAGNOSIS — O99210 Obesity complicating pregnancy, unspecified trimester: Secondary | ICD-10-CM

## 2017-11-03 DIAGNOSIS — Z3A37 37 weeks gestation of pregnancy: Secondary | ICD-10-CM

## 2017-11-03 DIAGNOSIS — O09523 Supervision of elderly multigravida, third trimester: Secondary | ICD-10-CM

## 2017-11-03 NOTE — Patient Instructions (Signed)
Braxton Hicks Contractions °Contractions of the uterus can occur throughout pregnancy, but they are not always a sign that you are in labor. You may have practice contractions called Braxton Hicks contractions. These false labor contractions are sometimes confused with true labor. °What are Braxton Hicks contractions? °Braxton Hicks contractions are tightening movements that occur in the muscles of the uterus before labor. Unlike true labor contractions, these contractions do not result in opening (dilation) and thinning of the cervix. Toward the end of pregnancy (32-34 weeks), Braxton Hicks contractions can happen more often and may become stronger. These contractions are sometimes difficult to tell apart from true labor because they can be very uncomfortable. You should not feel embarrassed if you go to the hospital with false labor. °Sometimes, the only way to tell if you are in true labor is for your health care provider to look for changes in the cervix. The health care provider will do a physical exam and may monitor your contractions. If you are not in true labor, the exam should show that your cervix is not dilating and your water has not broken. °If there are other health problems associated with your pregnancy, it is completely safe for you to be sent home with false labor. You may continue to have Braxton Hicks contractions until you go into true labor. °How to tell the difference between true labor and false labor °True labor °· Contractions last 30-70 seconds. °· Contractions become very regular. °· Discomfort is usually felt in the top of the uterus, and it spreads to the lower abdomen and low back. °· Contractions do not go away with walking. °· Contractions usually become more intense and increase in frequency. °· The cervix dilates and gets thinner. °False labor °· Contractions are usually shorter and not as strong as true labor contractions. °· Contractions are usually irregular. °· Contractions  are often felt in the front of the lower abdomen and in the groin. °· Contractions may go away when you walk around or change positions while lying down. °· Contractions get weaker and are shorter-lasting as time goes on. °· The cervix usually does not dilate or become thin. °Follow these instructions at home: °· Take over-the-counter and prescription medicines only as told by your health care provider. °· Keep up with your usual exercises and follow other instructions from your health care provider. °· Eat and drink lightly if you think you are going into labor. °· If Braxton Hicks contractions are making you uncomfortable: °? Change your position from lying down or resting to walking, or change from walking to resting. °? Sit and rest in a tub of warm water. °? Drink enough fluid to keep your urine pale yellow. Dehydration may cause these contractions. °? Do slow and deep breathing several times an hour. °· Keep all follow-up prenatal visits as told by your health care provider. This is important. °Contact a health care provider if: °· You have a fever. °· You have continuous pain in your abdomen. °Get help right away if: °· Your contractions become stronger, more regular, and closer together. °· You have fluid leaking or gushing from your vagina. °· You pass blood-tinged mucus (bloody show). °· You have bleeding from your vagina. °· You have low back pain that you never had before. °· You feel your baby’s head pushing down and causing pelvic pressure. °· Your baby is not moving inside you as much as it used to. °Summary °· Contractions that occur before labor are called Braxton   Hicks contractions, false labor, or practice contractions. °· Braxton Hicks contractions are usually shorter, weaker, farther apart, and less regular than true labor contractions. True labor contractions usually become progressively stronger and regular and they become more frequent. °· Manage discomfort from Braxton Hicks contractions by  changing position, resting in a warm bath, drinking plenty of water, or practicing deep breathing. °This information is not intended to replace advice given to you by your health care provider. Make sure you discuss any questions you have with your health care provider. °Document Released: 01/21/2017 Document Revised: 01/21/2017 Document Reviewed: 01/21/2017 °Elsevier Interactive Patient Education © 2018 Elsevier Inc. ° °

## 2017-11-03 NOTE — Progress Notes (Signed)
  Subjective  Fetal Movement? yes Contractions? no Leaking Fluid? no Vaginal Bleeding? No Concern for Macrosomia, based on exam and [place] Last visit.  Has been counseled as to IOL 39-40 weeks, is in agreement.  Desires PP BTL Objective  BP 122/80   Wt 238 lb (108 kg)   LMP 02/11/2017 (Exact Date)   BMI 32.28 kg/m  General: NAD Pumonary: no increased work of breathing Abdomen: gravid, non-tender Extremities: no edema Psychiatric: mood appropriate, affect full  Assessment  37 y.o. G2P1001 at [redacted]w[redacted]d by  11/18/2017, by Last Menstrual Period presenting for routine prenatal visit  Plan   Problem List Items Addressed This Visit      Other   Encounter for supervision of high risk multigravida of advanced maternal age, antepartum   Obesity in pregnancy   Supervision of elderly multigravida    Other Visit Diagnoses    [redacted] weeks gestation of pregnancy    -  Primary    As above.  Exam and visit, consideration for IOL thereafter (sch tentative for 11/12/17) PNV, [place], Labor precautions Breast feeding No s/sx HTN or preecalmpsia  Barnett Applebaum, MD, [facility] 11/03/2017  9:55 AM

## 2017-11-11 ENCOUNTER — Telehealth: Payer: Self-pay

## 2017-11-11 ENCOUNTER — Ambulatory Visit (INDEPENDENT_AMBULATORY_CARE_PROVIDER_SITE_OTHER): Payer: Medicaid Other | Admitting: Obstetrics & Gynecology

## 2017-11-11 VITALS — BP 138/90 | Wt 242.0 lb

## 2017-11-11 DIAGNOSIS — O09523 Supervision of elderly multigravida, third trimester: Secondary | ICD-10-CM

## 2017-11-11 DIAGNOSIS — O09529 Supervision of elderly multigravida, unspecified trimester: Secondary | ICD-10-CM

## 2017-11-11 DIAGNOSIS — Z3A39 39 weeks gestation of pregnancy: Secondary | ICD-10-CM

## 2017-11-11 DIAGNOSIS — O99210 Obesity complicating pregnancy, unspecified trimester: Secondary | ICD-10-CM

## 2017-11-11 NOTE — Patient Instructions (Signed)
Labor Induction Monday 0500  Labor induction is when steps are taken to cause a pregnant woman to begin the labor process. Most women go into labor on their own between 37 weeks and 42 weeks of the pregnancy. When this does not happen or when there is a medical need, methods may be used to induce labor. Labor induction causes a pregnant woman's uterus to contract. It also causes the cervix to soften (ripen), open (dilate), and thin out (efface). Usually, labor is not induced before 39 weeks of the pregnancy unless there is a problem with the baby or mother. Before inducing labor, your health care provider will consider a number of factors, including the following:  The medical condition of you and the baby.  How many weeks along you are.  The status of the baby's lung maturity.  The condition of the cervix.  The position of the baby.  What are the reasons for labor induction? Labor may be induced for the following reasons:  The health of the baby or mother is at risk.  The pregnancy is overdue by 1 week or more.  The water breaks but labor does not start on its own.  The mother has a health condition or serious illness, such as high blood pressure, infection, placental abruption, or diabetes.  The amniotic fluid amounts are low around the baby.  The baby is distressed.  Convenience or wanting the baby to be born on a certain date is not a reason for inducing labor. What methods are used for labor induction? Several methods of labor induction may be used, such as:  Prostaglandin medicine. This medicine causes the cervix to dilate and ripen. The medicine will also start contractions. It can be taken by mouth or by inserting a suppository into the vagina.  Inserting a thin tube (catheter) with a balloon on the end into the vagina to dilate the cervix. Once inserted, the balloon is expanded with water, which causes the cervix to open.  Stripping the membranes. Your health care  provider separates amniotic sac tissue from the cervix, causing the cervix to be stretched and causing the release of a hormone called progesterone. This may cause the uterus to contract. It is often done during an office visit. You will be sent home to wait for the contractions to begin. You will then come in for an induction.  Breaking the water. Your health care provider makes a hole in the amniotic sac using a small instrument. Once the amniotic sac breaks, contractions should begin. This may still take hours to see an effect.  Medicine to trigger or strengthen contractions. This medicine is given through an IV access tube inserted into a vein in your arm.  All of the methods of induction, besides stripping the membranes, will be done in the hospital. Induction is done in the hospital so that you and the baby can be carefully monitored. How long does it take for labor to be induced? Some inductions can take up to 2-3 days. Depending on the cervix, it usually takes less time. It takes longer when you are induced early in the pregnancy or if this is your first pregnancy. If a mother is still pregnant and the induction has been going on for 2-3 days, either the mother will be sent home or a cesarean delivery will be needed. What are the risks associated with labor induction? Some of the risks of induction include:  Changes in fetal heart rate, such as too high, too low,  or erratic.  Fetal distress.  Chance of infection for the mother and baby.  Increased chance of having a cesarean delivery.  Breaking off (abruption) of the placenta from the uterus (rare).  Uterine rupture (very rare).  When induction is needed for medical reasons, the benefits of induction may outweigh the risks. What are some reasons for not inducing labor? Labor induction should not be done if:  It is shown that your baby does not tolerate labor.  You have had previous surgeries on your uterus, such as a myomectomy  or the removal of fibroids.  Your placenta lies very low in the uterus and blocks the opening of the cervix (placenta previa).  Your baby is not in a head-down position.  The umbilical cord drops down into the birth canal in front of the baby. This could cut off the baby's blood and oxygen supply.  You have had a previous cesarean delivery.  There are unusual circumstances, such as the baby being extremely premature.  This information is not intended to replace advice given to you by your health care provider. Make sure you discuss any questions you have with your health care provider. Document Released: 01/27/2007 Document Revised: 02/13/2016 Document Reviewed: 04/06/2013 Elsevier Interactive Patient Education  2017 [employer].

## 2017-11-11 NOTE — Telephone Encounter (Signed)
FMLA/DISABILITY form for [employer] for [employer] filled out, signature obtained, and given to [place] for processing.

## 2017-11-11 NOTE — Progress Notes (Signed)
History and Physical  Erika Figueroa is a 37 y.o. G2P1001 [redacted]w[redacted]d  for Induction of Labor scheduled due to Macrosomia, Favorable cervic at term .   See labor record for pregnancy highlights.  No recent pain, bleeding, ruptured membranes, or other signs of progressing labor.  PMHx: She  has a past medical history of Hypertension. Also,  has a past surgical history that includes No past surgeries., family history includes ALS in her mother; Alzheimer's disease in her father; Dementia in her father; Diabetes in her maternal aunt and mother; Hyperlipidemia in her brother and father; Hypertension in her brother and father; Hypothyroidism in her sister.,  reports that  has never smoked. she has never used smokeless tobacco. She reports that she drinks alcohol. She reports that she does not use drugs. She has a current medication list which includes the following prescription(s): aspirin ec and folic acid. Also, has No Known Allergies. OB History  Gravida Para Term Preterm AB Living  2 1 1     1   SAB TAB Ectopic Multiple Live Births          1    # Outcome Date GA Lbr Len/2nd Weight Sex Delivery Anes PTL Lv  2 Current           1 Term 03/12/13 [redacted]w[redacted]d  6 lb 11 oz (3.033 kg) F Vag-Spont EPI N LIV     Complications: Thick meconium stained amniotic fluid    Patient denies any other pertinent gynecologic issues.   Review of Systems  Constitutional: Negative for chills, fever and malaise/fatigue.  HENT: Negative for congestion, sinus pain and sore throat.   Eyes: Negative for blurred vision and pain.  Respiratory: Negative for cough and wheezing.   Cardiovascular: Negative for chest pain and leg swelling.  Gastrointestinal: Negative for abdominal pain, constipation, diarrhea, heartburn, nausea and vomiting.  Genitourinary: Negative for dysuria, frequency, hematuria and urgency.  Musculoskeletal: Negative for back pain, joint pain, myalgias and neck pain.  Skin: Negative for itching and rash.    Neurological: Negative for dizziness, tremors and weakness.  Endo/Heme/Allergies: Does not bruise/bleed easily.  Psychiatric/Behavioral: Negative for depression. The patient is not nervous/anxious and does not have insomnia.     Objective: BP 138/90   Wt 242 lb (109.8 kg)   LMP 02/11/2017 (Exact Date)   BMI 32.82 kg/m  Physical Exam  Constitutional: She is oriented to person, place, and time. She appears well-developed and well-nourished. No distress.  Genitourinary: Rectum normal, vagina normal and uterus normal. Pelvic exam was performed with patient supine. There is no rash or lesion on the right labia. There is no rash or lesion on the left labia. Vagina exhibits no lesion. No bleeding in the vagina. Right adnexum does not display mass and does not display tenderness. Left adnexum does not display mass and does not display tenderness. Cervix does not exhibit motion tenderness, lesion, friability or polyp.   Uterus is mobile and midaxial. Uterus is not enlarged or exhibiting a mass.  Genitourinary Comments: CX 1/60/-3  HENT:  Head: Normocephalic and atraumatic. Head is without laceration.  Right Ear: Hearing normal.  Left Ear: Hearing normal.  Nose: No epistaxis.  No foreign bodies.  Mouth/Throat: Uvula is midline, oropharynx is clear and moist and mucous membranes are normal.  Eyes: Pupils are equal, round, and reactive to light.  Neck: Normal range of motion. Neck supple. No thyromegaly present.  Cardiovascular: Normal rate and regular rhythm. Exam reveals no gallop and no friction rub.  No murmur heard. Pulmonary/Chest: Effort normal and breath sounds normal. No respiratory distress. She has no wheezes. Right breast exhibits no mass, no skin change and no tenderness. Left breast exhibits no mass, no skin change and no tenderness.  Abdominal: Soft. Bowel sounds are normal. She exhibits no distension. There is no tenderness. There is no rebound.  Gravid, NT, Vtx FHT 150s   Musculoskeletal: Normal range of motion.  Neurological: She is alert and oriented to person, place, and time. No cranial nerve deficit.  Skin: Skin is warm and dry.  Psychiatric: She has a normal mood and affect. Judgment normal.  Vitals reviewed.  Assessment: Term Pregnancy for Induction of Labor due to Favorable cervix at term and Macrosomia; prior successful IOL/ vag delivery.  Plan: Patient will undergo induction of labor with scheduled for 2/25, cervical ripening agents.     Patient has been fully informed of the pros and cons, risks and benefits of continued observation with fetal monitoring versus that of induction of labor.   She understands that there are uncommon risks to induction, which include but are not limited to : frequent or prolonged uterine contractions, fetal distress, uterine rupture, and lack of successful induction.  These risks include all methods including Pitocin and Misoprostol and Cervadil.  Patient understands that using Misoprostol for labor induction is an "off label" indication although it has been studied extensively for this purpose and is an accepted method of induction.  She also has been informed of the increased risks for Cesarean with induction and should induction not be successful.  Patient consents to the induction plan of management.  Plans to breast Plans bilateral tubal ligation for contraception TDaP UTD 09/15/17  Barnett Applebaum, MD, [facility] 11/11/2017  9:13 AM

## 2017-11-11 NOTE — Progress Notes (Signed)
  [facility] REGIONAL BIRTHPLACE INDUCTION ASSESSMENT [place] [birth year] Medical record #: [id number] Phone #:   Home Phone [phone]  Home Phone [phone]    Prenatal Provider:[facility]ivering Group:[facility] Proposed admission date/time:11/15/17 at 0500 Method of induction:Cytotec  Weight: Filed Weights02/21/19 0836Weight:242 lb (109.8 kg) BMI Body mass index is 32.82 kg/m. HIV Negative HSV Negative EDC Estimated Date of Delivery: 2/28/19based on:US at [redacted] wks  Gestational age on admission: 53 Gravidity/parity:G2P1001  Cervix Score   0 1 2 3   Position Posterior Midposition Anterior   Consistency Firm Medium Soft   Effacement (%) 0-30 40-50 60-70 >80  Dilation (cm) Closed 1-2 3-4 >5  Baby's station -3 -2 -1 +1, +2   Bishop Score:5   Medical induction of labor  select indication(s) below Elective induction ?39 weeks multiparous patient MACROSOMIA   Medical Indications Adapted from [place] #[id number], "Medically Indicated Late Preterm and Early Term Deliveries," 2013.  PLACENTAL / UTERINE ISSUES FETAL ISSUES MATERNAL ISSUES  ? Placenta previa (36.0-37.6) ? Isoimmunization (37.0-38.6) ? Preeclampsia without severe features or gestational HTN (37.0)  ? Suspected accreta (34.0-35.6) ? Growth Restriction Nelda [place]) ? Preeclampsia with severe features (34.0)  ? Prior classical CD, uterine window, rupture (36.0-37.6) ? Isolated (38.0-39.6) ? Chronic HTN (38.0-39.6)  ? Prior myomectomy (37.0-38.6) ? Concurrent findings (34.0-37.6) ? Cholestasis (37.0)  ? 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 Growth Restriction (Twins) ? Diabetes  ? Placental abruption (chronic) ? Di-Di Isolated (36.0-37.6) ? Pregestational, controlled (39.0)  OBSTETRIC ISSUES ? Di-Di concurrent findings (32.0-34.6) ? Pregestational, uncontrolled (37.0-39.0)  ? Postdates ? (41 weeks) ? Mo-Di isolated (32.0-34.6) ? Pregestational, vascular compromise (37.0- 39.0)  ? PPROM (34.0)  ? Multiple Gestation ? Gestational, diet controlled (40.0)  ? Hx of IUFD (39.0 weeks) ? Di-Di (38.0-38.6) ? Gestational, med controlled (39.0)  ? Polyhydramnios, mild/moderate; SDV 8-16 or AFI 25-35 (39.0) ? Mo-Di (36.0-37.6) ? Gestational, uncontrolled (38.0-39.0)  ? Oligohydramnios (36.0-37.6); MVP <2 cm    Provider Signature: Hoyt Koch Scheduled CX:[id number] Date:11/11/2017 9:16 AM   Call [phone] to finalize the induction date/time  [id number] (07/17)

## 2017-11-12 ENCOUNTER — Inpatient Hospital Stay: Payer: Medicaid Other | Admitting: Anesthesiology

## 2017-11-12 ENCOUNTER — Inpatient Hospital Stay
Admission: EM | Admit: 2017-11-12 | Discharge: 2017-11-14 | DRG: 797 | Disposition: A | Discharge status: Home / Self Care | Payer: Medicaid Other | Source: Ambulatory Visit | Attending: Obstetrics & Gynecology | Admitting: Obstetrics & Gynecology

## 2017-11-12 ENCOUNTER — Other Ambulatory Visit: Payer: Self-pay

## 2017-11-12 DIAGNOSIS — Z302 Encounter for sterilization: Secondary | ICD-10-CM | POA: Diagnosis not present

## 2017-11-12 DIAGNOSIS — O9081 Anemia of the puerperium: Secondary | ICD-10-CM | POA: Diagnosis not present

## 2017-11-12 DIAGNOSIS — Z3A39 39 weeks gestation of pregnancy: Secondary | ICD-10-CM

## 2017-11-12 DIAGNOSIS — D62 Acute posthemorrhagic anemia: Secondary | ICD-10-CM | POA: Diagnosis not present

## 2017-11-12 DIAGNOSIS — O3663X0 Maternal care for excessive fetal growth, third trimester, not applicable or unspecified: Secondary | ICD-10-CM | POA: Diagnosis present

## 2017-11-12 DIAGNOSIS — O09529 Supervision of elderly multigravida, unspecified trimester: Secondary | ICD-10-CM

## 2017-11-12 DIAGNOSIS — O99210 Obesity complicating pregnancy, unspecified trimester: Secondary | ICD-10-CM

## 2017-11-12 DIAGNOSIS — O10019 Pre-existing essential hypertension complicating pregnancy, unspecified trimester: Secondary | ICD-10-CM

## 2017-11-12 DIAGNOSIS — O09523 Supervision of elderly multigravida, third trimester: Secondary | ICD-10-CM

## 2017-11-12 LAB — CBC
HEMATOCRIT: 37.9 % (ref 35.0–47.0)
Hemoglobin: 12.8 g/dL (ref 12.0–16.0)
MCH: 29.8 pg (ref 26.0–34.0)
MCHC: 33.7 g/dL (ref 32.0–36.0)
MCV: 88.4 fL (ref 80.0–100.0)
PLATELETS: 328 10*3/uL (ref 150–440)
RBC: 4.29 MIL/uL (ref 3.80–5.20)
RDW: 14.4 % (ref 11.5–14.5)
WBC: 14.9 10*3/uL — AB (ref 3.6–11.0)

## 2017-11-12 MED ORDER — BUTORPHANOL TARTRATE 1 MG/ML IJ SOLN
1.0000 mg | INTRAMUSCULAR | Status: DC | PRN
  Administered 2017-11-12: 1 mg via INTRAVENOUS
  Filled 2017-11-12: qty 1

## 2017-11-12 MED ORDER — SODIUM CHLORIDE 0.9 % IV SOLN
INTRAVENOUS | Status: DC | PRN
  Administered 2017-11-12 (×3): 5 mL via EPIDURAL

## 2017-11-12 MED ORDER — PHENYLEPHRINE 40 MCG/ML (10ML) SYRINGE FOR IV PUSH (FOR BLOOD PRESSURE SUPPORT): 80.0000 ug | PREFILLED_SYRINGE | INTRAVENOUS | Status: DC | PRN

## 2017-11-12 MED ORDER — TERBUTALINE SULFATE 1 MG/ML IJ SOLN: 0.2500 mg | Freq: Once | INTRAMUSCULAR | Status: DC | PRN

## 2017-11-12 MED ORDER — PENICILLIN G POT IN DEXTROSE 60000 UNIT/ML IV SOLN
3.0000 10*6.[IU] | INTRAVENOUS | Status: DC
  Filled 2017-11-12 (×4): qty 50

## 2017-11-12 MED ORDER — LACTATED RINGERS IV SOLN: 500.0000 mL | Freq: Once | INTRAVENOUS | Status: DC

## 2017-11-12 MED ORDER — MISOPROSTOL 25 MCG QUARTER TABLET: 25.0000 ug | ORAL_TABLET | ORAL | Status: DC | PRN

## 2017-11-12 MED ORDER — OXYTOCIN 10 UNIT/ML IJ SOLN
INTRAMUSCULAR | Status: AC
  Filled 2017-11-12: qty 2

## 2017-11-12 MED ORDER — FENTANYL 2.5 MCG/ML W/ROPIVACAINE 0.15% IN NS 100 ML EPIDURAL (ARMC): 12.0000 mL/h | EPIDURAL | Status: DC

## 2017-11-12 MED ORDER — EPHEDRINE 5 MG/ML INJ: 10.0000 mg | INTRAVENOUS | Status: DC | PRN

## 2017-11-12 MED ORDER — SODIUM CHLORIDE 0.9 % IV SOLN
5.0000 10*6.[IU] | Freq: Once | INTRAVENOUS | Status: AC
  Administered 2017-11-12: 5 10*6.[IU] via INTRAVENOUS
  Filled 2017-11-12: qty 5

## 2017-11-12 MED ORDER — FENTANYL 2.5 MCG/ML W/ROPIVACAINE 0.15% IN NS 100 ML EPIDURAL (ARMC)
EPIDURAL | Status: DC | PRN
  Administered 2017-11-12: 12 mL/h via EPIDURAL

## 2017-11-12 MED ORDER — DIPHENHYDRAMINE HCL 50 MG/ML IJ SOLN: 12.5000 mg | INTRAMUSCULAR | Status: DC | PRN

## 2017-11-12 MED ORDER — LACTATED RINGERS IV SOLN: 500.0000 mL | INTRAVENOUS | Status: DC | PRN

## 2017-11-12 MED ORDER — LIDOCAINE HCL (PF) 1 % IJ SOLN
INTRAMUSCULAR | Status: AC
  Administered 2017-11-12: 30 mL
  Filled 2017-11-12: qty 30

## 2017-11-12 MED ORDER — ACETAMINOPHEN 325 MG PO TABS: 650.0000 mg | ORAL_TABLET | ORAL | Status: DC | PRN

## 2017-11-12 MED ORDER — AMMONIA AROMATIC IN INHA
RESPIRATORY_TRACT | Status: AC
  Filled 2017-11-12: qty 10

## 2017-11-12 MED ORDER — LACTATED RINGERS IV SOLN: INTRAVENOUS | Status: DC

## 2017-11-12 MED ORDER — OXYTOCIN 40 UNITS IN LACTATED RINGERS INFUSION - SIMPLE MED
2.5000 [IU]/h | INTRAVENOUS | Status: DC
  Filled 2017-11-12: qty 1000

## 2017-11-12 MED ORDER — ONDANSETRON HCL 4 MG/2ML IJ SOLN: 4.0000 mg | Freq: Four times a day (QID) | INTRAMUSCULAR | Status: DC | PRN

## 2017-11-12 MED ORDER — LIDOCAINE HCL (PF) 1 % IJ SOLN
INTRAMUSCULAR | Status: DC | PRN
  Administered 2017-11-12: 2 mL via SUBCUTANEOUS

## 2017-11-12 MED ORDER — OXYTOCIN BOLUS FROM INFUSION
500.0000 mL | Freq: Once | INTRAVENOUS | Status: DC
  Administered 2017-11-12: 500 mL via INTRAVENOUS

## 2017-11-12 MED ORDER — FENTANYL 2.5 MCG/ML W/ROPIVACAINE 0.15% IN NS 100 ML EPIDURAL (ARMC)
EPIDURAL | Status: AC
  Filled 2017-11-12: qty 100

## 2017-11-12 MED ORDER — MISOPROSTOL 200 MCG PO TABS
ORAL_TABLET | ORAL | Status: AC
  Filled 2017-11-12: qty 4

## 2017-11-12 NOTE — Progress Notes (Signed)
Dr Kenton Kingfisher paged, call returned, made aware of pt arrival to BP laboring, c/o regular painful contractions with pelvic pressure since 3p. Last exam yesterday, 1cm. Current exam, 3.5/70/-1. Per MD, start PIV, fluid and antibiotics, may have stadol for pain, call with any changes.

## 2017-11-12 NOTE — H&P (Signed)
History and Physical Interval Note:  11/12/2017 11:01 PM  Erika Figueroa  has presented today despite planned INDUCTION OF LABOR for Macrosomia on Monday with painful ctxs tonight. The various methods of treatment have been discussed with the patient and family. The patient's history has been reviewed, patient examined, cervical change noted, and is stable for labor management at this time.  See H&P. I have reviewed the patient's chart and labs.  Questions were answered to the patient's satisfaction.    Barnett Applebaum, MD, [facility], [facility] 11/12/2017  11:01 PM

## 2017-11-12 NOTE — Progress Notes (Signed)
Patient came in c/o of contractions all day today. However since 1700 contractions are closer together and more intense. Reports feeling intense pelvic pressure, making it difficult to walk. Patient reports positive fetal movement and loosing mucous plug.   Patient has scheduled IOL on  11/15/17 due to macrosomia. EFM applied and assessing. Initial BP of 147/94. Denies headaches at this moment, denies visual changes, denies epigastric pain. Reflexes+1.

## 2017-11-12 NOTE — Anesthesia Procedure Notes (Signed)
Epidural Patient location during procedure: OB Start time: 11/12/2017 11:01 PM End time: 11/12/2017 11:08 PM  Staffing Anesthesiologist: Martha Clan, MD Performed: anesthesiologist   Preanesthetic Checklist Completed: patient identified, site marked, surgical consent, pre-op evaluation, timeout performed, IV checked, risks and benefits discussed and monitors and equipment checked  Epidural Patient position: sitting Prep: ChloraPrep Patient monitoring: heart rate, continuous pulse ox and blood pressure Approach: midline Location: L3-L4 Injection technique: LOR saline  Needle:  Needle type: Tuohy  Needle gauge: 17 G Needle length: 9 cm and 9 Needle insertion depth: 7 cm Catheter type: closed end flexible Catheter size: 19 Gauge Catheter at skin depth: 12 cm Test dose: negative and 1.5% lidocaine with Epi 1:200 K  Assessment Sensory level: T10 Events: blood not aspirated, injection not painful, no injection resistance, negative IV test and no paresthesia  Additional Notes Pt. Evaluated and documentation done after procedure finished. Patient identified. Risks/Benefits/Options discussed with patient including but not limited to bleeding, infection, nerve damage, paralysis, failed block, incomplete pain control, headache, blood pressure changes, nausea, vomiting, reactions to medication both or allergic, itching and postpartum back pain. Confirmed with bedside nurse the patient's most recent platelet count. Confirmed with patient that they are not currently taking any anticoagulation, have any bleeding history or any family history of bleeding disorders. Patient expressed understanding and wished to proceed. All questions were answered. Sterile technique was used throughout the entire procedure. Please see nursing notes for vital signs. Test dose was given through epidural catheter and negative prior to continuing to dose epidural or start infusion. Warning signs of high block given  to the patient including shortness of breath, tingling/numbness in hands, complete motor block, or any concerning symptoms with instructions to call for help. Patient was given instructions on fall risk and not to get out of bed. All questions and concerns addressed with instructions to call with any issues or inadequate analgesia.   Patient tolerated the insertion well without immediate complications.Reason for block:procedure for pain

## 2017-11-12 NOTE — Anesthesia Preprocedure Evaluation (Signed)
Anesthesia Evaluation  Patient identified by MRN, date of birth, ID band Patient awake    Reviewed: Allergy & Precautions, H&P , NPO status , Patient's Chart, lab work & pertinent test results, reviewed documented beta blocker date and time   History of Anesthesia Complications Negative for: history of anesthetic complications  Airway Mallampati: III  TM Distance: >3 FB Neck ROM: full    Dental no notable dental hx.    Pulmonary neg pulmonary ROS,           Cardiovascular Exercise Tolerance: Good hypertension, (-) angina(-) dysrhythmias (-) Valvular Problems/Murmurs     Neuro/Psych negative neurological ROS  negative psych ROS   GI/Hepatic Neg liver ROS, GERD  ,  Endo/Other  negative endocrine ROS  Renal/GU negative Renal ROS  negative genitourinary   Musculoskeletal   Abdominal   Peds  Hematology negative hematology ROS (+)   Anesthesia Other Findings Past Medical History: No date: Hypertension   Reproductive/Obstetrics (+) Pregnancy                             Anesthesia Physical Anesthesia Plan  ASA: II  Anesthesia Plan: Epidural   Post-op Pain Management:    Induction:   PONV Risk Score and Plan:   Airway Management Planned:   Additional Equipment:   Intra-op Plan:   Post-operative Plan:   Informed Consent: I have reviewed the patients History and Physical, chart, labs and discussed the procedure including the risks, benefits and alternatives for the proposed anesthesia with the patient or authorized representative who has indicated his/her understanding and acceptance.   Dental Advisory Given  Plan Discussed with: Anesthesiologist, CRNA and Surgeon  Anesthesia Plan Comments:         Anesthesia Quick Evaluation

## 2017-11-13 ENCOUNTER — Inpatient Hospital Stay: Payer: Medicaid Other | Admitting: Anesthesiology

## 2017-11-13 ENCOUNTER — Encounter
Admission: EM | Disposition: A | Discharge status: Home / Self Care | Payer: Self-pay | Source: Ambulatory Visit | Attending: Obstetrics & Gynecology

## 2017-11-13 DIAGNOSIS — Z3A39 39 weeks gestation of pregnancy: Secondary | ICD-10-CM

## 2017-11-13 DIAGNOSIS — Z302 Encounter for sterilization: Secondary | ICD-10-CM

## 2017-11-13 HISTORY — PX: TUBAL LIGATION: SHX77

## 2017-11-13 LAB — CBC
HCT: 34 % — ABNORMAL LOW (ref 35.0–47.0)
HEMOGLOBIN: 11.3 g/dL — AB (ref 12.0–16.0)
MCH: 29.2 pg (ref 26.0–34.0)
MCHC: 33.2 g/dL (ref 32.0–36.0)
MCV: 88 fL (ref 80.0–100.0)
Platelets: 303 10*3/uL (ref 150–440)
RBC: 3.87 MIL/uL (ref 3.80–5.20)
RDW: 14.2 % (ref 11.5–14.5)
WBC: 20.2 10*3/uL — ABNORMAL HIGH (ref 3.6–11.0)

## 2017-11-13 LAB — TYPE AND SCREEN
ABO/RH(D): O POS
Antibody Screen: NEGATIVE

## 2017-11-13 SURGERY — LIGATION, FALLOPIAN TUBE, POSTPARTUM
Anesthesia: General | Laterality: Bilateral

## 2017-11-13 MED ORDER — ONDANSETRON HCL 4 MG/2ML IJ SOLN
INTRAMUSCULAR | Status: DC | PRN
  Administered 2017-11-13: 4 mg via INTRAVENOUS

## 2017-11-13 MED ORDER — FENTANYL CITRATE (PF) 100 MCG/2ML IJ SOLN: 25.0000 ug | INTRAMUSCULAR | Status: DC | PRN

## 2017-11-13 MED ORDER — OXYCODONE-ACETAMINOPHEN 5-325 MG PO TABS: 2.0000 | ORAL_TABLET | ORAL | Status: DC | PRN

## 2017-11-13 MED ORDER — KETOROLAC TROMETHAMINE 30 MG/ML IJ SOLN
INTRAMUSCULAR | Status: AC
  Filled 2017-11-13: qty 1

## 2017-11-13 MED ORDER — OXYTOCIN 40 UNITS IN LACTATED RINGERS INFUSION - SIMPLE MED
INTRAVENOUS | Status: AC
  Administered 2017-11-13: 07:00:00
  Filled 2017-11-13: qty 1000

## 2017-11-13 MED ORDER — DEXAMETHASONE SODIUM PHOSPHATE 10 MG/ML IJ SOLN
INTRAMUSCULAR | Status: DC | PRN
  Administered 2017-11-13: 10 mg via INTRAVENOUS

## 2017-11-13 MED ORDER — ZOLPIDEM TARTRATE 5 MG PO TABS: 5.0000 mg | ORAL_TABLET | Freq: Every evening | ORAL | Status: DC | PRN

## 2017-11-13 MED ORDER — SENNOSIDES-DOCUSATE SODIUM 8.6-50 MG PO TABS
2.0000 | ORAL_TABLET | ORAL | Status: DC
  Administered 2017-11-13: 2 via ORAL
  Filled 2017-11-13: qty 2

## 2017-11-13 MED ORDER — MIDAZOLAM HCL 2 MG/2ML IJ SOLN
INTRAMUSCULAR | Status: DC | PRN
  Administered 2017-11-13: 2 mg via INTRAVENOUS

## 2017-11-13 MED ORDER — DIPHENHYDRAMINE HCL 25 MG PO CAPS: 25.0000 mg | ORAL_CAPSULE | Freq: Four times a day (QID) | ORAL | Status: DC | PRN

## 2017-11-13 MED ORDER — ONDANSETRON HCL 4 MG/2ML IJ SOLN: 4.0000 mg | INTRAMUSCULAR | Status: DC | PRN

## 2017-11-13 MED ORDER — LIDOCAINE HCL (CARDIAC) 20 MG/ML IV SOLN
INTRAVENOUS | Status: DC | PRN
  Administered 2017-11-13: 100 mg via INTRAVENOUS

## 2017-11-13 MED ORDER — ONDANSETRON HCL 4 MG/2ML IJ SOLN: 4.0000 mg | Freq: Once | INTRAMUSCULAR | Status: DC | PRN

## 2017-11-13 MED ORDER — ONDANSETRON HCL 4 MG/2ML IJ SOLN
INTRAMUSCULAR | Status: AC
  Filled 2017-11-13: qty 2

## 2017-11-13 MED ORDER — PROPOFOL 10 MG/ML IV BOLUS
INTRAVENOUS | Status: AC
  Filled 2017-11-13: qty 20

## 2017-11-13 MED ORDER — COCONUT OIL OIL: 1.0000 "application " | TOPICAL_OIL | Status: DC | PRN

## 2017-11-13 MED ORDER — SUCCINYLCHOLINE CHLORIDE 20 MG/ML IJ SOLN
INTRAMUSCULAR | Status: AC
  Filled 2017-11-13: qty 1

## 2017-11-13 MED ORDER — MENTHOL 3 MG MT LOZG
1.0000 | LOZENGE | OROMUCOSAL | Status: DC | PRN
  Filled 2017-11-13: qty 9

## 2017-11-13 MED ORDER — PROPOFOL 10 MG/ML IV BOLUS
INTRAVENOUS | Status: DC | PRN
  Administered 2017-11-13: 150 mg via INTRAVENOUS

## 2017-11-13 MED ORDER — BENZOCAINE-MENTHOL 20-0.5 % EX AERO
INHALATION_SPRAY | CUTANEOUS | Status: AC
  Filled 2017-11-13: qty 56

## 2017-11-13 MED ORDER — SUCCINYLCHOLINE CHLORIDE 20 MG/ML IJ SOLN
INTRAMUSCULAR | Status: DC | PRN
  Administered 2017-11-13: 100 mg via INTRAVENOUS

## 2017-11-13 MED ORDER — DIBUCAINE 1 % RE OINT: 1.0000 "application " | TOPICAL_OINTMENT | RECTAL | Status: DC | PRN

## 2017-11-13 MED ORDER — DEXAMETHASONE SODIUM PHOSPHATE 10 MG/ML IJ SOLN
INTRAMUSCULAR | Status: AC
  Filled 2017-11-13: qty 1

## 2017-11-13 MED ORDER — BENZOCAINE-MENTHOL 20-0.5 % EX AERO: 1.0000 "application " | INHALATION_SPRAY | CUTANEOUS | Status: DC | PRN

## 2017-11-13 MED ORDER — SIMETHICONE 80 MG PO CHEW
80.0000 mg | CHEWABLE_TABLET | ORAL | Status: DC | PRN
  Administered 2017-11-14: 80 mg via ORAL
  Filled 2017-11-13: qty 1

## 2017-11-13 MED ORDER — ONDANSETRON HCL 4 MG PO TABS: 4.0000 mg | ORAL_TABLET | ORAL | Status: DC | PRN

## 2017-11-13 MED ORDER — IBUPROFEN 600 MG PO TABS
600.0000 mg | ORAL_TABLET | Freq: Four times a day (QID) | ORAL | Status: DC
  Administered 2017-11-13 – 2017-11-14 (×5): 600 mg via ORAL
  Filled 2017-11-13 (×6): qty 1

## 2017-11-13 MED ORDER — LIDOCAINE HCL (PF) 2 % IJ SOLN
INTRAMUSCULAR | Status: AC
  Filled 2017-11-13: qty 10

## 2017-11-13 MED ORDER — SODIUM CHLORIDE 0.9 % IV SOLN: 250.0000 mL | INTRAVENOUS | Status: DC | PRN

## 2017-11-13 MED ORDER — BUPIVACAINE-EPINEPHRINE 0.25% -1:200000 IJ SOLN
INTRAMUSCULAR | Status: DC | PRN
  Administered 2017-11-13: 3 mL

## 2017-11-13 MED ORDER — HYDROMORPHONE HCL 1 MG/ML IJ SOLN: 1.0000 mg | INTRAMUSCULAR | Status: DC | PRN

## 2017-11-13 MED ORDER — MIDAZOLAM HCL 2 MG/2ML IJ SOLN
INTRAMUSCULAR | Status: AC
  Filled 2017-11-13: qty 2

## 2017-11-13 MED ORDER — LACTATED RINGERS IV SOLN
INTRAVENOUS | Status: DC | PRN
  Administered 2017-11-13 (×2): via INTRAVENOUS

## 2017-11-13 MED ORDER — SODIUM CHLORIDE 0.9% FLUSH: 3.0000 mL | INTRAVENOUS | Status: DC | PRN

## 2017-11-13 MED ORDER — LACTATED RINGERS IV SOLN: INTRAVENOUS | Status: DC

## 2017-11-13 MED ORDER — WITCH HAZEL-GLYCERIN EX PADS: 1.0000 "application " | MEDICATED_PAD | CUTANEOUS | Status: DC | PRN

## 2017-11-13 MED ORDER — KETOROLAC TROMETHAMINE 30 MG/ML IJ SOLN
INTRAMUSCULAR | Status: DC | PRN
  Administered 2017-11-13: 30 mg via INTRAVENOUS

## 2017-11-13 MED ORDER — BUPIVACAINE-EPINEPHRINE (PF) 0.25% -1:200000 IJ SOLN
INTRAMUSCULAR | Status: AC
  Filled 2017-11-13: qty 30

## 2017-11-13 MED ORDER — IBUPROFEN 600 MG PO TABS
600.0000 mg | ORAL_TABLET | Freq: Four times a day (QID) | ORAL | Status: DC
  Administered 2017-11-13: 600 mg via ORAL
  Filled 2017-11-13: qty 1

## 2017-11-13 MED ORDER — SODIUM CHLORIDE 0.9% FLUSH
3.0000 mL | Freq: Two times a day (BID) | INTRAVENOUS | Status: DC
  Administered 2017-11-14: 3 mL via INTRAVENOUS

## 2017-11-13 MED ORDER — FENTANYL CITRATE (PF) 250 MCG/5ML IJ SOLN
INTRAMUSCULAR | Status: AC
  Filled 2017-11-13: qty 5

## 2017-11-13 MED ORDER — LIDOCAINE HCL 4 % MT SOLN
OROMUCOSAL | Status: DC | PRN
  Administered 2017-11-13: 4 mL via TOPICAL

## 2017-11-13 MED ORDER — OXYCODONE-ACETAMINOPHEN 5-325 MG PO TABS: 1.0000 | ORAL_TABLET | ORAL | Status: DC | PRN

## 2017-11-13 MED ORDER — FENTANYL CITRATE (PF) 100 MCG/2ML IJ SOLN
INTRAMUSCULAR | Status: DC | PRN
  Administered 2017-11-13: 25 ug via INTRAVENOUS
  Administered 2017-11-13: 50 ug via INTRAVENOUS
  Administered 2017-11-13 (×2): 25 ug via INTRAVENOUS
  Administered 2017-11-13: 50 ug via INTRAVENOUS
  Administered 2017-11-13: 25 ug via INTRAVENOUS
  Administered 2017-11-13: 50 ug via INTRAVENOUS

## 2017-11-13 MED ORDER — ACETAMINOPHEN 325 MG PO TABS
650.0000 mg | ORAL_TABLET | ORAL | Status: DC | PRN
  Administered 2017-11-13 – 2017-11-14 (×2): 650 mg via ORAL
  Filled 2017-11-13 (×2): qty 2

## 2017-11-13 SURGICAL SUPPLY — 27 items
ADH SKN CLS APL DERMABOND .7 (GAUZE/BANDAGES/DRESSINGS) ×1
BLADE SURG SZ11 CARB STEEL (BLADE) ×3 IMPLANT
CHLORAPREP W/TINT 26ML (MISCELLANEOUS) ×3 IMPLANT
DERMABOND ADVANCED (GAUZE/BANDAGES/DRESSINGS) ×2
DERMABOND ADVANCED .7 DNX12 (GAUZE/BANDAGES/DRESSINGS) ×1 IMPLANT
DRAPE LAPAROTOMY 77X122 PED (DRAPES) ×3 IMPLANT
ELECT CAUTERY BLADE 6.4 (BLADE) ×3 IMPLANT
ELECT REM PT RETURN 9FT ADLT (ELECTROSURGICAL) ×3
ELECTRODE REM PT RTRN 9FT ADLT (ELECTROSURGICAL) ×1 IMPLANT
GLOVE BIO SURGEON STRL SZ7 (GLOVE) ×7 IMPLANT
GLOVE BIOGEL PI IND STRL 7.5 (GLOVE) ×1 IMPLANT
GLOVE BIOGEL PI INDICATOR 7.5 (GLOVE) ×2
GOWN STRL REUS W/ TWL LRG LVL3 (GOWN DISPOSABLE) ×2 IMPLANT
GOWN STRL REUS W/ TWL XL LVL3 (GOWN DISPOSABLE) ×1 IMPLANT
GOWN STRL REUS W/TWL LRG LVL3 (GOWN DISPOSABLE) ×6
GOWN STRL REUS W/TWL XL LVL3 (GOWN DISPOSABLE)
KIT TURNOVER CYSTO (KITS) ×3 IMPLANT
LABEL OR SOLS (LABEL) ×3 IMPLANT
NEEDLE HYPO 22GX1.5 SAFETY (NEEDLE) ×3 IMPLANT
NS IRRIG 500ML POUR BTL (IV SOLUTION) ×3 IMPLANT
PACK BASIN MINOR ARMC (MISCELLANEOUS) ×3 IMPLANT
SUT MNCRL 4-0 (SUTURE) ×3
SUT MNCRL 4-0 27XMFL (SUTURE) ×1
SUT PLAIN GUT 0 (SUTURE) ×6 IMPLANT
SUT VIC AB 2-0 UR6 27 (SUTURE) ×3 IMPLANT
SUTURE MNCRL 4-0 27XMF (SUTURE) ×1 IMPLANT
SYR 10ML LL (SYRINGE) ×3 IMPLANT

## 2017-11-13 NOTE — Anesthesia Preprocedure Evaluation (Signed)
Anesthesia Evaluation  Patient identified by MRN, date of birth, ID band Patient awake    Reviewed: Allergy & Precautions, NPO status , Patient's Chart, lab work & pertinent test results, reviewed documented beta blocker date and time   Airway Mallampati: II  TM Distance: >3 FB     Dental  (+) Chipped   Pulmonary           Cardiovascular hypertension, Pt. on medications      Neuro/Psych    GI/Hepatic   Endo/Other    Renal/GU      Musculoskeletal   Abdominal   Peds  Hematology   Anesthesia Other Findings   Reproductive/Obstetrics                             Anesthesia Physical Anesthesia Plan  ASA: II  Anesthesia Plan: General   Post-op Pain Management:    Induction: Intravenous, Rapid sequence and Cricoid pressure planned  PONV Risk Score and Plan:   Airway Management Planned: Oral ETT  Additional Equipment:   Intra-op Plan:   Post-operative Plan:   Informed Consent: I have reviewed the patients History and Physical, chart, labs and discussed the procedure including the risks, benefits and alternatives for the proposed anesthesia with the patient or authorized representative who has indicated his/her understanding and acceptance.     Plan Discussed with: CRNA  Anesthesia Plan Comments:         Anesthesia Quick Evaluation

## 2017-11-13 NOTE — Progress Notes (Signed)
15 minute call to floor. 

## 2017-11-13 NOTE — Transfer of Care (Signed)
Immediate Anesthesia Transfer of Care Note  Patient: Erika Figueroa  Procedure(s) Performed: POST PARTUM TUBAL LIGATION (Bilateral )  Patient Location: PACU  Anesthesia Type:General  Level of Consciousness: awake, alert  and oriented  Airway & Oxygen Therapy: Patient Spontanous Breathing and Patient connected to nasal cannula oxygen  Post-op Assessment: Report given to RN and Post -op Vital signs reviewed and stable  Post vital signs: Reviewed and stable  Last Vitals:  Vitals:   11/13/17 0741 11/13/17 1209  BP: 138/85 132/77  Pulse: 89 76  Resp: 19 20  Temp: 36.7 C 36.9 C  SpO2: 96% 98%    Last Pain:  Vitals:   11/13/17 1209  TempSrc: Oral  PainSc:          Complications: No apparent anesthesia complications

## 2017-11-13 NOTE — Anesthesia Post-op Follow-up Note (Signed)
Anesthesia QCDR form completed.        

## 2017-11-13 NOTE — Op Note (Signed)
     Op Note Postpartum Tubal Ligation  Pre-Op Diagnosis: multiparity, desires permanent sterilization  Post-Op Diagnosis: multiparity, desires permanent sterilization  Procedures:  Postpartum tubal ligation via Pomeroy method  Primary Surgeon: [name], MD  EBL: 10 ml   IVF: 1,000 mL   Specimens: portion of right and left fallopian tubes  Drains: None  Complications: None   Disposition: PACU   Condition: Stable   Findings: normal appearing bilateral fallopian tubes  Indication: The patient is a 37 y.o. [id number] who is postpartum day 1 status post spontaneous vaginal delivery.  She has been counseled extensively regarding risks, benefits, and alternatives to tubal ligation, including non-permanent forms of contraception that are equivalent in efficacy with potentially better side effects.  She has been advised that there is a failure rate of 3-5 in every 1,000 tubal ligations per year with an increased risk of ectopic pregnancy should pregnancy occur.   Procedure Summary:  The patient was taken to the operating room where general anesthesia was administered and found to be adequate. After timeout was called a small transverse, infraumbilical incision was made with the scalpel. The incision was carried down through the fascia until the peritoneum was identified and entered. The peritoneum was noted to be free of any adhesions and the incision was then extended.  The patient's left fallopian tube was identified, brought incision, and grasped with a Babcock clamp. The tube was then followed out to the fimbria. The Babcock clamp was then used to grasp the tube approximately 4 cm from the cornual region. A 3 cm segment of tube was then ligated with the 2 free ties of plain gut, and excised. Good hemostasis was noted and the tube was returned to the abdomen. The right fallopian tube was then ligated, and a 3 cm segment excised in a similar fashion. Excellent hemostasis was noted, and  the tube returned to the abdomen.  The peritoneum and fascia were closed in a single layer using 2-0 Vicryl. The subcutaneous tissue was re-approximated to reduce tension on the skin closure using a 3-0 vicryl.  The skin was closed in a subcuticular fashion using 4-0 vicryl, undyed. The closure was also closed with Dermabond.  Sponge, lap, needle, and instrument counts were correct x 2.  VTE prophylaxis: SCDs. Antibiotic prophylaxis: none indicated. The patient tolerated the procedure well and was taken to the PACU in stable condition.   [name], MD 11/13/2017 2:44 PM

## 2017-11-13 NOTE — Anesthesia Postprocedure Evaluation (Signed)
Anesthesia Post Note  Patient: Erika Figueroa  Procedure(s) Performed: POST PARTUM TUBAL LIGATION (Bilateral )  Patient location during evaluation: PACU Anesthesia Type: General Level of consciousness: awake and alert Pain management: pain level controlled Vital Signs Assessment: post-procedure vital signs reviewed and stable Respiratory status: spontaneous breathing, nonlabored ventilation, respiratory function stable and patient connected to nasal cannula oxygen Cardiovascular status: blood pressure returned to baseline and stable Postop Assessment: no apparent nausea or vomiting Anesthetic complications: no     Last Vitals:  Vitals:   11/13/17 1209 11/13/17 1455  BP: 132/77 130/80  Pulse: 76 93  Resp: 20   Temp: 36.9 C 36.7 C  SpO2: 98% 99%    Last Pain:  Vitals:   11/13/17 1209  TempSrc: Oral  PainSc:                  Millard Bautch S

## 2017-11-13 NOTE — Progress Notes (Signed)
Patient transported to OR for BTL at this time. Pre-procedure checklist completed.    Hilbert Bible, RN

## 2017-11-13 NOTE — Progress Notes (Signed)
Patient back from OR at this time.   Hilbert Bible, RN

## 2017-11-13 NOTE — Anesthesia Procedure Notes (Signed)
Procedure Name: Intubation Date/Time: 11/13/2017 1:55 PM Performed by: Clinton Sawyer, CRNA Pre-anesthesia Checklist: Patient identified, Emergency Drugs available, Suction available, Patient being monitored and Timeout performed Patient Re-evaluated:Patient Re-evaluated prior to induction Oxygen Delivery Method: Circle system utilized Preoxygenation: Pre-oxygenation with 100% oxygen Induction Type: IV induction and Rapid sequence Laryngoscope Size: Mac and 4 Grade View: Grade I Tube type: Oral Tube size: 6.5 mm Number of attempts: 1 Placement Confirmation: ETT inserted through vocal cords under direct vision,  positive ETCO2 and breath sounds checked- equal and bilateral Secured at: 22 cm Tube secured with: Tape Dental Injury: Teeth and Oropharynx as per pre-operative assessment

## 2017-11-13 NOTE — Progress Notes (Signed)
Patient ID: Erika Figueroa, female   DOB: 07-09-1981, 37 y.o.   MRN: [id number]  Obstetric Postpartum Daily Progress Note Subjective:  Erika y.o. [id number] postpartum day #1 status post vaginal delivery.  She is ambulating, is tolerating po, is voiding spontaneously.  Her pain is well controlled on PO pain medications. Her lochia is less than menses. NPO since 0400.    Medications SCHEDULED MEDICATIONS  . benzocaine-Menthol      . ibuprofen  600 mg Oral Q6H  . misoprostol      . oxytocin      . [START ON 11/14/2017] senna-docusate  2 tablet Oral Q24H  . sodium chloride flush  3 mL Intravenous Q12H    MEDICATION INFUSIONS  . sodium chloride      PRN MEDICATIONS  sodium chloride flush **AND** sodium chloride flush **AND** sodium chloride, acetaminophen, benzocaine-Menthol, coconut oil, witch hazel-glycerin **AND** dibucaine, diphenhydrAMINE, ondansetron **OR** ondansetron (ZOFRAN) IV, oxyCODONE-acetaminophen, oxyCODONE-acetaminophen, simethicone, zolpidem    Objective:   Vitals:   11/13/17 0117 11/13/17 0410 11/13/17 0504 11/13/17 0741  BP: 135/77 (!) 155/85 135/72 138/85  Pulse: Erika 86 89 89  Resp: 16 16 17 19   Temp: 98.7 F (37.1 C) 98.3 F (36.8 C) 98.6 F (37 C) 98 F (36.7 C)  TempSrc: Oral Oral Oral Oral  SpO2:  96% 99% 96%  Weight:      Height:        Current Vital Signs 24h Vital Sign Ranges  T 98 F (36.7 C) Temp  Avg: 98.3 F (36.8 C)  Min: 97.9 F (36.6 C)  Max: 98.7 F (37.1 C)  BP 138/85 BP  Min: 125/93  Max: 155/85  HR 89 Pulse  Avg: 94.3  Min: Erika  Max: 108  RR 19 Resp  Avg: 17.2  Min: 16  Max: 19  SaO2 96 % Not Delivered SpO2  Avg: 97.8 %  Min: 96 %  Max: 99 %       24 Hour I/O Current Shift I/O  Time Ins Outs 02/22 0701 - 02/23 0700 In: -  Out: Erika Figueroa [Urine:800] 02/23 0701 - 02/23 1900 In: -  Out: 600 [Urine:600]  General: NAD Pulmonary: CTAB CV: RRR Abdomen: non-distended, non-tender, fundus firm at level of umbilicus Extremities: no edema, no  erythema, no tenderness, TED hose in place.   Labs:  Recent Labs  Lab 11/12/17 2131 11/13/17 0609  WBC Erika.9* 20.2*  HGB 12.8 11.3*  HCT 37.9 34.0*  PLT 328 303     Assessment:   Erika y.o. G2P2002 postpartum day # 1 status post SVD, doing well. Desires permanent sterility.  Plan:   1) Acute blood loss anemia - hemodynamically stable and asymptomatic - po ferrous sulfate  2) O POS / Rubella 30.50 (06/27 1205)/ Varicella Immune  3) TDAP status: received  09/15/17, flu vaccine: received 08/17/17  4) breast feeding/Contraception = bilateral tubal ligation   5) Desires permanent sterilization: Other reversible forms of contraception were discussed with patient; she declines all other modalities. Permanent nature of as well as associated risks of the procedure discussed with patient including but not limited to: risk of regret, permanence of method, bleeding, infection, injury to surrounding organs and need for additional procedures.  Failure risk of 0.5-1% with increased risk of ectopic gestation if pregnancy occurs was also discussed with patient.  To OR this afternoon. She has been NPO since 0400.   6) Disposition: home PPD 2  [name], MD 11/13/2017 9:24 AM

## 2017-11-13 NOTE — Discharge Summary (Signed)
OB Discharge Summary     Patient Name: Erika Figueroa DOB: 1980/10/22 MRN: [id number]  Date of admission: 11/12/2017 Delivering MD: Hoyt Koch, MD  Date of Delivery: 11/12/2017  Date of discharge: 11/14/2017  Admitting diagnosis: 1 Intrauterine pregnancy: [redacted]w[redacted]d     Secondary diagnosis: Macrosomia     Discharge diagnosis: Term Pregnancy Delivered, (no macrosomia)                         Hospital course:  Onset of Labor With Vaginal Delivery     37 y.o. yo G2P1001 at [redacted]w[redacted]d was admitted in Active Labor on 11/12/2017. Patient had an uncomplicated labor course as follows:  Membrane Rupture Time/Date: 11:08 PM ,11/12/2017   Intrapartum Procedures: Episiotomy: None [1]                                         Lacerations:  2nd degree [3]  Patient had a delivery of a Viable infant. 11/12/2017  Information for the patient's newborn:  Alynn, Ellith[place]pe [[id number]]  Delivery Method: Vag-Spont    Pateint had an uncomplicated postpartum course.  On PPD#1 she underwent a postpartum tubal ligation without incident.  on PPD#2 she is ambulating, tolerating a regular diet, passing flatus, and urinating well. Patient is discharged home in stable condition on 11/14/17.                                                                  Post partum procedures:postpartum tubal ligation  Complications: None  Physical exam on 11/14/2017: Vitals:   11/14/17 0008 11/14/17 0311 11/14/17 0352 11/14/17 0855  BP: 124/66 (!) 125/98 130/84 134/76  Pulse: 79 78 70 89  Resp: 16 16    Temp: 99.2 F (37.3 C) 98.1 F (36.7 C)  98.3 F (36.8 C)  TempSrc:  Oral  Oral  SpO2: 98% 99%  99%  Weight:      Height:       General: alert, cooperative and no distress  Pulm: CTAB CV: RRR Lochia: appropriate Uterine Fundus: firm Incision: Healing well with no significant drainage, Dressing is clean, dry, and intact DVT Evaluation: No evidence of DVT seen on physical exam. No cords or calf tenderness. No  significant calf/ankle edema.  Labs: Lab Results  Component Value Date   WBC 14.4 (H) 11/14/2017   HGB 10.6 (L) 11/14/2017   HCT 31.5 (L) 11/14/2017   MCV 88.3 11/14/2017   PLT 275 11/14/2017   CMP Latest Ref Rng & Units 05/26/2017  Glucose 65 - 99 mg/dL 98  BUN 6 - 20 mg/dL 6  Creatinine 0.57 - 1.00 mg/dL 0.65  Sodium 134 - 144 mmol/L 140  Potassium 3.5 - 5.2 mmol/L 3.7  Chloride 96 - 106 mmol/L 106  CO2 20 - 29 mmol/L 17(L)  Calcium 8.7 - 10.2 mg/dL 9.4  Total Protein 6.0 - 8.5 g/dL 6.7  Total Bilirubin 0.0 - 1.2 mg/dL 0.5  Alkaline Phos 39 - 117 IU/L 79  AST 0 - 40 IU/L 20  ALT 0 - 32 IU/L 14    Discharge instruction: per After Visit Summary.  Medications:  Allergies as of  11/14/2017   No Known Allergies     Medication List    STOP taking these medications   aspirin EC 81 MG tablet   folic acid 1 MG tablet Commonly known as:  FOLVITE     TAKE these medications   ibuprofen 600 MG tablet Commonly known as:  ADVIL,MOTRIN Take 1 tablet (600 mg total) by mouth every 6 (six) hours.   multivitamin-prenatal 27-0.8 MG Tabs tablet Take 1 tablet by mouth daily at 12 noon.   oxyCODONE-acetaminophen 5-325 MG tablet Commonly known as:  PERCOCET/ROXICET Take 1 tablet by mouth every 6 (six) hours as needed (breakthrough pain).            Discharge Care Instructions  (From admission, onward)        Start     Ordered   11/14/17 0000  Discharge wound care:    Comments:  Perform wound care instructions   11/14/17 1030      Diet: routine diet  Activity: Advance as tolerated. Pelvic rest for 6 weeks.   Outpatient follow up: Follow-up Information    Gae Dry, MD. Schedule an appointment as soon as possible for a visit in 6 week(s).   Specialty:  Obstetrics and Gynecology Contact information: [address] [place] [address] [phone]             Postpartum contraception: Tubal Ligation Rhogam Given postpartum: no Rubella vaccine  given postpartum: no Varicella vaccine given postpartum: no TDaP given antepartum or postpartum: Yes  Newborn Data: Live born female  Birth Weight:   APGAR: 25,   Newborn Delivery   Birth date/time:  11/12/2017 23:40:00 Delivery type:  Vaginal, Spontaneous     Baby Feeding: Breast  Disposition:home with mother  SIGNED: [name], MD 11/14/2017 10:31 AM

## 2017-11-14 ENCOUNTER — Encounter: Payer: Self-pay | Admitting: Obstetrics and Gynecology

## 2017-11-14 LAB — CBC
HEMATOCRIT: 31.5 % — AB (ref 35.0–47.0)
Hemoglobin: 10.6 g/dL — ABNORMAL LOW (ref 12.0–16.0)
MCH: 29.7 pg (ref 26.0–34.0)
MCHC: 33.7 g/dL (ref 32.0–36.0)
MCV: 88.3 fL (ref 80.0–100.0)
PLATELETS: 275 10*3/uL (ref 150–440)
RBC: 3.57 MIL/uL — ABNORMAL LOW (ref 3.80–5.20)
RDW: 14.3 % (ref 11.5–14.5)
WBC: 14.4 10*3/uL — ABNORMAL HIGH (ref 3.6–11.0)

## 2017-11-14 LAB — RPR: RPR: NONREACTIVE

## 2017-11-14 MED ORDER — IBUPROFEN 600 MG PO TABS: 600.0000 mg | ORAL_TABLET | Freq: Four times a day (QID) | ORAL | 0 refills | Status: DC

## 2017-11-14 MED ORDER — OXYCODONE-ACETAMINOPHEN 5-325 MG PO TABS: 1.0000 | ORAL_TABLET | Freq: Four times a day (QID) | ORAL | 0 refills | Status: DC | PRN

## 2017-11-14 NOTE — Discharge Instructions (Signed)
Please call your doctor or return to the ER if you experience any chest pains, shortness of breath, dizziness, visual changes, fever greater than 101, any heavy bleeding (saturating more than 1 pad per hour), large clots, or foul smelling discharge, any worsening abdominal pain and cramping that is not controlled by pain medication, or any signs of postpartum depression. No tampons, enemas, douches, or sexual intercourse for 6 weeks. Also avoid tub baths, hot tubs, or swimming for 6 weeks.   Check your incision daily for any signs of infection such as redness, warmth, swelling, increased pain, or pus/foul smelling drainage

## 2017-11-14 NOTE — Progress Notes (Signed)
11/14/2017 9:07 PM  BP 130/81 (BP Location: Right Arm)   Pulse 92   Temp 98.2 F (36.8 C) (Oral)   Resp 18   Ht 6' (182.9 cm)   Wt 243 lb (110224 g)   LMP 02/11/2017 (Exact Date)   SpO2 99%   Breastfeeding? Unknown   BMI 32.96 kg/m  Patient discharged per MD orders. Discharge instructions reviewed with patient and patient verbalized understanding.  Prescriptions discussed with patient. Discharged via wheelchair escorted by RN, infant, and husband.  Almedia Balls, RN

## 2017-11-14 NOTE — Lactation Note (Signed)
This note was copied from a baby's chart. Lactation Consultation Note  Patient Name: Erika Figueroa HYHOO'I Date: 11/14/2017  Mom unable to breast feed at present stating she is having too much pain after bilateral tubal ligation.  Discussed donor breast milk for supplementation since unable to hand express any colostrum.  Mom declines donor milk wanting baby to get Gerber formula.  Mom does not want to give Mliss Fritz a bottle.  Alternative feeding options discussed and mom chose to let LC finger feed baby with curved tip syringe. Father of baby observed feeding in case further supplementation is needed.    Maternal Data    Feeding Feeding Type: Breast Milk  LATCH Score                   Interventions    Lactation Tools Discussed/Used     Consult Status      Jarold Motto 11/14/2017, 4:55 PM

## 2017-11-14 NOTE — Anesthesia Postprocedure Evaluation (Signed)
Anesthesia Post Note  Patient: Erika Figueroa  Procedure(s) Performed: AN AD HOC LABOR EPIDURAL  Patient location during evaluation: Mother Baby Anesthesia Type: Epidural Level of consciousness: awake and alert Pain management: pain level controlled Vital Signs Assessment: post-procedure vital signs reviewed and stable Respiratory status: spontaneous breathing, nonlabored ventilation and respiratory function stable Cardiovascular status: stable Postop Assessment: no headache, no backache and epidural receding Anesthetic complications: no     Last Vitals:  Vitals:   11/14/17 0855 11/14/17 1204  BP: 134/76 122/70  Pulse: 89 77  Resp:    Temp: 36.8 C 36.6 C  SpO2: 99% 98%    Last Pain:  Vitals:   11/14/17 1204  TempSrc: Oral  PainSc:                  [name] S

## 2017-11-14 NOTE — Lactation Note (Signed)
This note was copied from a baby's chart. Lactation Consultation Note  Patient Name: Erika Figueroa HYQMV'H Date: 11/14/2017  Assisted mom with positioning with pillow support in both cradle, cross cradle and football holds skin to skin.  Demonstrated hand expression, but only got 2 drops after several attempts.  Mom is requesting nipple shield stating she used nipple shield with first because had tongue issues and really sore nipples in beginning, but went on to breast feed first baby for 6 months  Erika Figueroa pulls his lower lip inward and after a few uncoordinated sucks begins to thrust breast out of his mouth with his tongue. Mom's nipples are everted, but he only wants to get on top portion of nipple.  #20 nipple shield placed on first, but seems a little tight so switched to #24 which mom feels is more comfortable.  Nipple shield seems to help with keeping lower lip outward and tongue underneath breast, but still tongue thrusting.  Attempted on both breasts in many different positions until he fell asleep at breast.  Discussed routine newborn feeding patterns and normal course of lactation.  Mom going for bilateral tubal ligation surgery around 1 pm.  Encouraged mom to offer breast whenever he demonstrated feeding cues and not to go longer than 2 to 2 1/2 hours between feedings for now since going for surgery today.  Lactation name and number written on white board and encouraged to call for questions, concerns or assistance.        Maternal Data    Feeding    LATCH Score                   Interventions    Lactation Tools Discussed/Used     Consult Status      Jarold Motto 11/14/2017, 4:39 PM

## 2017-11-14 NOTE — Lactation Note (Signed)
This note was copied from a baby's chart. Lactation Consultation Note  Patient Name: Erika Figueroa YTKPT'[id number] Date: 11/14/2017  Assisted mom with getting in comfortable position to breast feed.  Could only hand express a couple of drops of colostrum after several attempts.  Erika Figueroa will not latch without NS.  He does latch with NS with lower lip outward.  He only took a few weak sucks before fussing and pushing to the tip of the nipple shield with his tongue and refusing to continue to suck.  Once the tip of the nipple shield was filled with formula, he relatched and took a few more rhythmic sucks before falling asleep.  Formula pulled up in curved tip syringe and ran along side of nipple shield.  He continued to suck taking in a total of 7 ml through curved tip syringe at the breast.       Maternal Data    Feeding Feeding Type: Breast Milk  LATCH Score                   Interventions    Lactation Tools Discussed/Used     Consult Status      Erika Figueroa 11/14/2017, 5:01 PM

## 2017-11-16 LAB — SURGICAL PATHOLOGY

## 2017-11-17 ENCOUNTER — Telehealth: Payer: Self-pay | Admitting: Obstetrics & Gynecology

## 2017-11-17 NOTE — Telephone Encounter (Signed)
Patient aware of FMLA paperwork being faxed through to [employer].

## 2017-12-31 ENCOUNTER — Ambulatory Visit (INDEPENDENT_AMBULATORY_CARE_PROVIDER_SITE_OTHER): Payer: Medicaid Other | Admitting: Obstetrics & Gynecology

## 2017-12-31 ENCOUNTER — Encounter: Payer: Self-pay | Admitting: Obstetrics & Gynecology

## 2017-12-31 NOTE — Progress Notes (Signed)
  OBSTETRICS POSTPARTUM CLINIC PROGRESS NOTE  Subjective:     Erika Figueroa is a 37 y.o. [id number] female who presents for a postpartum visit. She is 6 weeks postpartum following a Term pregnancy and delivery by Vaginal, no problems at delivery.  I have fully reviewed the prenatal and intrapartum course. Anesthesia: epidural.  Postpartum course has been complicated by uncomplicated.  Baby is feeding by Breast.  Bleeding: patient has not  resumed menses.  Bowel function is normal. Bladder function is normal.  Patient is not sexually active. Contraception method desired is tubal ligation.  Postpartum depression screening: Pt has had some sad days and some crying from feeling overwhelmed.  Has not sought out counseling. [place] 13.  The following portions of the patient's history were reviewed and updated as appropriate: allergies, current medications, past family history, past medical history, past social history, past surgical history and problem list.  Review of Systems Pertinent items noted in HPI and remainder of comprehensive ROS otherwise negative.  Objective:    BP 130/82   Pulse 72   Ht 6' (1.829 m)   Wt 225 lb (102.1 kg)   LMP 12/22/2017   BMI 30.52 kg/m   General:  alert and no distress   Breasts:  inspection negative, no nipple discharge or bleeding, no masses or nodularity palpable  Lungs: clear to auscultation bilaterally  Heart:  regular rate and rhythm, S1, S2 normal, no murmur, click, rub or gallop  Abdomen: soft, non-tender; bowel sounds normal; no masses,  no organomegaly.  Well healed tubal incision   Vulva:  normal  Vagina: normal vagina, no discharge, exudate, lesion, or erythema  Cervix:  no cervical motion tenderness and no lesions  Corpus: normal size, contour, position, consistency, mobility, non-tender  Adnexa:  normal adnexa and no mass, fullness, tenderness  Rectal Exam: Not performed.        Assessment:  Post Partum Care visit 1. Encounter for  postnatal visit  Plan:  See orders and Patient Instructions Moniktor for worsening PPD; consider meds or counseling Resume all normal activities Follow up in: 4 months or as needed.   Barnett Applebaum, MD, [facility], [facility] 12/31/2017  10:24 AM

## 2018-01-03 ENCOUNTER — Telehealth: Payer: Self-pay

## 2018-01-03 NOTE — Telephone Encounter (Signed)
Spoke w/pt. Verified method of delivery. Pt requests letter to be faxed to [phone]. Letter created & faxed per patient request.

## 2018-01-03 NOTE — Telephone Encounter (Signed)
Pt needs a note to return to work from maternity leave. [phone].

## 2020-03-21 DIAGNOSIS — Z01419 Encounter for gynecological examination (general) (routine) without abnormal findings: Secondary | ICD-10-CM | POA: Diagnosis not present

## 2020-03-21 DIAGNOSIS — Z113 Encounter for screening for infections with a predominantly sexual mode of transmission: Secondary | ICD-10-CM | POA: Diagnosis not present

## 2020-03-21 DIAGNOSIS — Z124 Encounter for screening for malignant neoplasm of cervix: Secondary | ICD-10-CM | POA: Diagnosis not present

## 2020-03-21 DIAGNOSIS — I10 Essential (primary) hypertension: Secondary | ICD-10-CM | POA: Diagnosis not present

## 2020-03-21 DIAGNOSIS — Z131 Encounter for screening for diabetes mellitus: Secondary | ICD-10-CM | POA: Diagnosis not present

## 2020-03-21 DIAGNOSIS — Z13220 Encounter for screening for lipoid disorders: Secondary | ICD-10-CM | POA: Diagnosis not present

## 2020-04-04 DIAGNOSIS — I10 Essential (primary) hypertension: Secondary | ICD-10-CM | POA: Diagnosis not present

## 2020-04-24 ENCOUNTER — Other Ambulatory Visit (HOSPITAL_COMMUNITY): Payer: Self-pay | Admitting: Family

## 2020-04-24 DIAGNOSIS — Z1231 Encounter for screening mammogram for malignant neoplasm of breast: Secondary | ICD-10-CM

## 2021-01-02 DIAGNOSIS — H5213 Myopia, bilateral: Secondary | ICD-10-CM | POA: Diagnosis not present

## 2021-05-06 NOTE — Progress Notes (Signed)
Pcp, No   Chief Complaint  Patient presents with   Menstrual Problem    Heavy cycles at times and lasting at times up to two weeks, back pain, clotting a lot at beg of cycle    HPI:      Erika Figueroa is a 40 y.o. [id number] whose LMP was Patient's last menstrual period was 04/18/2021 (exact date)., presents today for NP> 3 yrs eval of irregular menses since 5/22. Menses are usually monthly, lasting 7 days, 2-3 days mod to heavy flow with small to medium sized clots, no BTB, mild dysmen improved with heating pad/tylenol. Since 5/22, menses still monthly but lasting 2+ wks with occas BTB, same flow and dysmen, although had LBP this last period. No increased stress, wt change, surgery, sickness. No recent thyroid check. FH hypothyroidism in her sister. Did OCPs in distant past without side effects.  Neg pap/neg HPV DNA 7/21 at [facility].  She is sex active, no pain/bleeding. S/p TL. Pt with hx of HTN.   Past Medical History:  Diagnosis Date   Hypertension     Past Surgical History:  Procedure Laterality Date   TUBAL LIGATION Bilateral 11/13/2017   Procedure: POST PARTUM TUBAL LIGATION;  Surgeon: Will Bonnet, MD;  Location: [facility] ORS;  Service: Gynecology;  Laterality: Bilateral;    Family History  Problem Relation Age of Onset   Diabetes Mother    ALS Mother    Hypertension Father    Alzheimer's disease Father    Hyperlipidemia Father    Dementia Father    Hypothyroidism Sister    Hyperlipidemia Brother    Hypertension Brother    Diabetes Maternal Aunt     Social History   Socioeconomic History   Marital status: Single    Spouse name: Reggie   Number of children: 1   Years of education: Not on file   Highest education level: Not on file  Occupational History   Occupation: Administrator  Tobacco Use   Smoking status: Never   Smokeless tobacco: Never  Vaping Use   Vaping Use: Never used  Substance and Sexual Activity   Alcohol use: No     Comment: prior to positive ICON   Drug use: No   Sexual activity: Yes    Partners: Male    Birth control/protection: Surgical    Comment: Tubal Ligation  Other Topics Concern   Not on file  Social History Narrative   Not on file   Social Determinants of Health   Financial Resource Strain: Not on file  Food Insecurity: Not on file  Transportation Needs: Not on file  Physical Activity: Not on file  Stress: Not on file  Social Connections: Not on file  Intimate Partner Violence: Not on file    Outpatient Medications Prior to Visit  Medication Sig Dispense Refill   losartan (COZAAR) 100 MG tablet Take 100 mg by mouth daily.     ibuprofen (ADVIL,MOTRIN) 600 MG tablet Take 1 tablet (600 mg total) by mouth every 6 (six) hours. (Patient not taking: Reported on 12/31/2017) 30 tablet 0   Prenatal Vit-Fe Fumarate-FA (MULTIVITAMIN-PRENATAL) 27-0.8 MG TABS tablet Take 1 tablet by mouth daily at 12 noon.     No facility-administered medications prior to visit.      ROS:  Review of Systems  Constitutional:  Negative for fever.  Gastrointestinal:  Negative for blood in stool, constipation, diarrhea, nausea and vomiting.  Genitourinary:  Positive for menstrual problem. Negative for  dyspareunia, dysuria, flank pain, frequency, hematuria, urgency, vaginal bleeding, vaginal discharge and vaginal pain.  Musculoskeletal:  Negative for back pain.  Skin:  Negative for rash.  BREAST: No symptoms   OBJECTIVE:   Vitals:  BP 140/80   Ht 6' (1.829 m)   Wt 226 lb (102.5 kg)   LMP 04/18/2021 (Exact Date)   Breastfeeding No   BMI 30.65 kg/m   Physical Exam Vitals reviewed.  Constitutional:      Appearance: She is well-developed.  Pulmonary:     Effort: Pulmonary effort is normal.  Genitourinary:    General: Normal vulva.     Pubic Area: No rash.      Labia:        Right: No rash, tenderness or lesion.        Left: No rash, tenderness or lesion.      Vagina: Normal. No vaginal  discharge, erythema or tenderness.     Cervix: Normal.     Uterus: Normal. Not enlarged and not tender.      Adnexa: Right adnexa normal and left adnexa normal.       Right: No mass or tenderness.         Left: No mass or tenderness.    Musculoskeletal:        General: Normal range of motion.     Cervical back: Normal range of motion.  Skin:    General: Skin is warm and dry.  Neurological:     General: No focal deficit present.     Mental Status: She is alert and oriented to person, place, and time.  Psychiatric:        Mood and Affect: Mood normal.        Behavior: Behavior normal.        Thought Content: Thought content normal.        Judgment: Judgment normal.    Assessment/Plan: Abnormal uterine bleeding (AUB) - Plan: TSH, CBC with Differential/Platelet, US PELVIC COMPLETE WITH TRANSVAGINAL; Sx since 5/22; check labs and Gyn u/s. Will f/u with results. If WNL, discussed hormones, IUD, ablation for cycle control.   Thyroid disorder screening - Plan: TSH  Menorrhagia with regular cycle - Plan: CBC with Differential/Platelet    Return if symptoms worsen or fail to improve.  Esco Joslyn B. Jerald Hennington, PA-C 05/07/2021 11:58 AM

## 2021-05-07 ENCOUNTER — Encounter: Payer: Self-pay | Admitting: Obstetrics and Gynecology

## 2021-05-07 ENCOUNTER — Ambulatory Visit (INDEPENDENT_AMBULATORY_CARE_PROVIDER_SITE_OTHER): Payer: Self-pay | Admitting: Obstetrics and Gynecology

## 2021-05-07 ENCOUNTER — Other Ambulatory Visit: Payer: Self-pay

## 2021-05-07 VITALS — BP 140/80 | Ht 72.0 in | Wt 226.0 lb

## 2021-05-07 DIAGNOSIS — N920 Excessive and frequent menstruation with regular cycle: Secondary | ICD-10-CM

## 2021-05-07 DIAGNOSIS — N939 Abnormal uterine and vaginal bleeding, unspecified: Secondary | ICD-10-CM | POA: Diagnosis not present

## 2021-05-07 DIAGNOSIS — Z1329 Encounter for screening for other suspected endocrine disorder: Secondary | ICD-10-CM | POA: Diagnosis not present

## 2021-05-08 LAB — CBC WITH DIFFERENTIAL/PLATELET
Basophils Absolute: 0 10*3/uL (ref 0.0–0.2)
Basos: 0 %
EOS (ABSOLUTE): 0.1 10*3/uL (ref 0.0–0.4)
Eos: 1 %
Hematocrit: 43.8 % (ref 34.0–46.6)
Hemoglobin: 14.8 g/dL (ref 11.1–15.9)
Immature Grans (Abs): 0 10*3/uL (ref 0.0–0.1)
Immature Granulocytes: 0 %
Lymphocytes Absolute: 1.9 10*3/uL (ref 0.7–3.1)
Lymphs: 24 %
MCH: 30.7 pg (ref 26.6–33.0)
MCHC: 33.8 g/dL (ref 31.5–35.7)
MCV: 91 fL (ref 79–97)
Monocytes Absolute: 0.5 10*3/uL (ref 0.1–0.9)
Monocytes: 6 %
Neutrophils Absolute: 5.3 10*3/uL (ref 1.4–7.0)
Neutrophils: 69 %
Platelets: 288 10*3/uL (ref 150–450)
RBC: 4.82 x10E6/uL (ref 3.77–5.28)
RDW: 13.4 % (ref 11.7–15.4)
WBC: 7.7 10*3/uL (ref 3.4–10.8)

## 2021-05-08 LAB — TSH: TSH: 1.12 u[IU]/mL (ref 0.450–4.500)

## 2021-05-14 ENCOUNTER — Ambulatory Visit (HOSPITAL_COMMUNITY): Payer: BC Managed Care – PPO

## 2021-05-16 ENCOUNTER — Other Ambulatory Visit: Payer: Self-pay

## 2021-05-16 ENCOUNTER — Ambulatory Visit (HOSPITAL_COMMUNITY)
Admission: RE | Admit: 2021-05-16 | Discharge: 2021-05-16 | Disposition: A | Discharge status: Home / Self Care | Payer: BC Managed Care – PPO | Source: Ambulatory Visit | Attending: Obstetrics and Gynecology | Admitting: Obstetrics and Gynecology

## 2021-05-16 DIAGNOSIS — N939 Abnormal uterine and vaginal bleeding, unspecified: Secondary | ICD-10-CM | POA: Diagnosis not present

## 2021-05-21 ENCOUNTER — Encounter: Payer: Self-pay | Admitting: Obstetrics and Gynecology

## 2021-06-30 ENCOUNTER — Ambulatory Visit: Payer: Medicaid Other | Admitting: Obstetrics and Gynecology

## 2021-09-21 DIAGNOSIS — D0511 Intraductal carcinoma in situ of right breast: Secondary | ICD-10-CM

## 2021-09-21 HISTORY — DX: Intraductal carcinoma in situ of right breast: D05.11

## 2021-12-01 ENCOUNTER — Ambulatory Visit: Payer: BC Managed Care – PPO | Admitting: Podiatry

## 2021-12-10 NOTE — Progress Notes (Signed)
? ? ?Erika Lick, NP ? ? ?Chief Complaint  ?Erika Figueroa presents with  ? Knot in Right Breast  ? ? ?HPI: ?     Erika Figueroa is a 41 y.o. [id number] whose LMP was Erika Figueroa's last menstrual period was 11/21/2021 (exact date)., presents today for RT breast mass for 2-3 months, noticed on SBE. Thinks it has gotten a little larger, no pain/erythema/trauma. No nipple d/c. No hx of breast masses or imaging in past. Her sister had "breast cancer cells" on bx about 5 yrs ago (age 83) without tx or surgery. Pt will get more info about this. Pt has pat 2nd cousins with breast cancer.  ?Past due for annual/pap due by 6/23.  ?Seen 8/22 for AUB with neg Gyn u/s, sx improved.  ? ?Erika Figueroa Active Problem List  ? Diagnosis Date Noted  ? Mass of upper inner quadrant of right breast 12/11/2021  ? Encounter for sterilization 11/13/2017  ? 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 VAGINITIS 12/26/2009  ? OVERWEIGHT 12/04/2009  ? HYPERTENSION 12/04/2009  ? ALLERGIC RHINITIS 12/04/2009  ? FATIGUE 12/04/2009  ? ? ?Past Surgical History:  ?Procedure Laterality Date  ? TUBAL LIGATION Bilateral 11/13/2017  ? Procedure: POST PARTUM TUBAL LIGATION;  Surgeon: Will Bonnet, MD;  Location: [facility] ORS;  Service: Gynecology;  Laterality: Bilateral;  ? ? ?Family History  ?Problem Relation Age of Onset  ? Diabetes Mother   ? ALS Mother   ? Hypertension Father   ? Alzheimer's disease Father   ? Hyperlipidemia Father   ? Dementia Father   ? Hypothyroidism Sister   ? Other Sister 30  ?     "breast cancer cells", no tx done  ? Hyperlipidemia Brother   ? Hypertension Brother   ? Diabetes Maternal Aunt   ? ? ?Social History  ? ?Socioeconomic History  ? Marital status: Married  ?  Spouse name: Reggie  ? Number of children: 1  ? Years of education: Not on file  ? Highest education level: Not on file  ?Occupational History  ? Occupation: Administrator  ?Tobacco Use  ? Smoking status: Never  ? Smokeless tobacco: Never  ?Vaping Use  ? Vaping Use: Never used   ?Substance and Sexual Activity  ? Alcohol use: No  ?  Comment: prior to positive ICON  ? Drug use: No  ? Sexual activity: Yes  ?  Partners: Male  ?  Birth control/protection: Surgical  ?  Comment: Tubal Ligation  ?Other Topics Concern  ? Not on file  ?Social History Narrative  ? Not on file  ? ?Social Determinants of Health  ? ?Financial Resource Strain: Not on file  ?Food Insecurity: Not on file  ?Transportation Needs: Not on file  ?Physical Activity: Not on file  ?Stress: Not on file  ?Social Connections: Not on file  ?Intimate Partner Violence: Not on file  ? ? ?Outpatient Medications Prior to Visit  ?Medication Sig Dispense Refill  ? losartan (COZAAR) 100 MG tablet Take 100 mg by mouth daily.    ? ?No facility-administered medications prior to visit.  ? ? ? ? ?ROS: ? ?Review of Systems  ?Constitutional:  Negative for fever.  ?Gastrointestinal:  Negative for blood in stool, constipation, diarrhea, nausea and vomiting.  ?Genitourinary:  Negative for dyspareunia, dysuria, flank pain, frequency, hematuria, urgency, vaginal bleeding, vaginal discharge and vaginal pain.  ?Musculoskeletal:  Negative for back pain.  ?Skin:  Negative for rash.  ?BREAST: mass ? ? ?OBJECTIVE:  ? ?Vitals:  ?BP 122/72  Ht '6\' 1"'$  (1.854 m)   Wt 227 lb (103 kg)   LMP 11/21/2021 (Exact Date)   BMI 29.95 kg/m?  ? ?Physical Exam ?Vitals reviewed.  ?Pulmonary:  ?   Effort: Pulmonary effort is normal.  ?Chest:  ?Breasts: ?   Breasts are symmetrical.  ?   Right: No inverted nipple, mass, nipple discharge, skin change or tenderness.  ?   Left: No inverted nipple, mass, nipple discharge, skin change or tenderness.  ? ? ?Musculoskeletal:     ?   General: Normal range of motion.  ?   Cervical back: Normal range of motion.  ?Skin: ?   General: Skin is warm and dry.  ?Neurological:  ?   General: No focal deficit present.  ?   Mental Status: She is alert and oriented to person, place, and time.  ?   Cranial Nerves: No cranial nerve deficit.   ?Psychiatric:     ?   Mood and Affect: Mood normal.     ?   Behavior: Behavior normal.     ?   Thought Content: Thought content normal.     ?   Judgment: Judgment normal.  ? ? ?Assessment/Plan: ?Mass of upper inner quadrant of right breast - Plan: US BREAST LTD UNI RIGHT INC AXILLA, US BREAST LTD UNI LEFT INC AXILLA, MM DIAG BREAST TOMO BILATERAL; pt to schedule dx mammo and u/s; will f/u with results.  ? ?Family history of breast cancer--pt to get better info from sister re: dx and tx. Qualifies for cancer genetic testing if cancer.  ? ? ? ? Return in about 1 month (around 01/11/2022) for annual. ? ?Wood Novacek B. Vondell Babers, PA-C ?12/11/2021 ?9:55 AM ? ? ? ? ? ?

## 2021-12-11 ENCOUNTER — Encounter: Payer: Self-pay | Admitting: Obstetrics and Gynecology

## 2021-12-11 ENCOUNTER — Other Ambulatory Visit: Payer: Self-pay

## 2021-12-11 ENCOUNTER — Ambulatory Visit (INDEPENDENT_AMBULATORY_CARE_PROVIDER_SITE_OTHER): Payer: BC Managed Care – PPO | Admitting: Obstetrics and Gynecology

## 2021-12-11 VITALS — BP 122/72 | Ht 73.0 in | Wt 227.0 lb

## 2021-12-11 DIAGNOSIS — Z803 Family history of malignant neoplasm of breast: Secondary | ICD-10-CM | POA: Diagnosis not present

## 2021-12-11 DIAGNOSIS — N6312 Unspecified lump in the right breast, upper inner quadrant: Secondary | ICD-10-CM | POA: Diagnosis not present

## 2021-12-11 NOTE — Patient Instructions (Signed)
I value your feedback and you entrusting us with your care. If you get a [facility] patient survey, I would appreciate you taking the time to let us know about your experience today. Thank you! ? ? ?

## 2021-12-29 ENCOUNTER — Other Ambulatory Visit: Payer: Self-pay | Admitting: Obstetrics and Gynecology

## 2021-12-29 ENCOUNTER — Ambulatory Visit
Admission: RE | Admit: 2021-12-29 | Discharge: 2021-12-29 | Disposition: A | Discharge status: Home / Self Care | Payer: BC Managed Care – PPO | Source: Ambulatory Visit | Attending: Obstetrics and Gynecology | Admitting: Obstetrics and Gynecology

## 2021-12-29 DIAGNOSIS — N6312 Unspecified lump in the right breast, upper inner quadrant: Secondary | ICD-10-CM

## 2021-12-29 DIAGNOSIS — R922 Inconclusive mammogram: Secondary | ICD-10-CM | POA: Diagnosis not present

## 2021-12-29 DIAGNOSIS — N63 Unspecified lump in unspecified breast: Secondary | ICD-10-CM

## 2021-12-29 DIAGNOSIS — R928 Other abnormal and inconclusive findings on diagnostic imaging of breast: Secondary | ICD-10-CM

## 2022-01-09 DIAGNOSIS — H5213 Myopia, bilateral: Secondary | ICD-10-CM | POA: Diagnosis not present

## 2022-01-19 ENCOUNTER — Ambulatory Visit
Admission: RE | Admit: 2022-01-19 | Discharge: 2022-01-19 | Disposition: A | Discharge status: Home / Self Care | Payer: BC Managed Care – PPO | Source: Ambulatory Visit | Attending: Obstetrics and Gynecology | Admitting: Obstetrics and Gynecology

## 2022-01-19 DIAGNOSIS — R928 Other abnormal and inconclusive findings on diagnostic imaging of breast: Secondary | ICD-10-CM

## 2022-01-19 DIAGNOSIS — N63 Unspecified lump in unspecified breast: Secondary | ICD-10-CM

## 2022-01-19 DIAGNOSIS — D0591 Unspecified type of carcinoma in situ of right breast: Secondary | ICD-10-CM | POA: Diagnosis not present

## 2022-01-19 DIAGNOSIS — D0511 Intraductal carcinoma in situ of right breast: Secondary | ICD-10-CM | POA: Diagnosis not present

## 2022-01-19 HISTORY — PX: BREAST BIOPSY: SHX20

## 2022-01-21 LAB — SURGICAL PATHOLOGY

## 2022-01-22 DIAGNOSIS — D0511 Intraductal carcinoma in situ of right breast: Secondary | ICD-10-CM

## 2022-01-27 ENCOUNTER — Encounter: Payer: Self-pay | Admitting: Obstetrics and Gynecology

## 2022-01-28 ENCOUNTER — Encounter: Payer: Self-pay | Admitting: Oncology

## 2022-01-28 ENCOUNTER — Inpatient Hospital Stay: Payer: BC Managed Care – PPO | Attending: Oncology | Admitting: Oncology

## 2022-01-28 ENCOUNTER — Encounter: Payer: Self-pay | Admitting: *Deleted

## 2022-01-28 ENCOUNTER — Inpatient Hospital Stay: Payer: BC Managed Care – PPO

## 2022-01-28 VITALS — BP 161/106 | HR 121 | Temp 98.5°F | Resp 18 | Ht 72.0 in | Wt 221.3 lb

## 2022-01-28 DIAGNOSIS — D0511 Intraductal carcinoma in situ of right breast: Secondary | ICD-10-CM | POA: Diagnosis not present

## 2022-01-28 DIAGNOSIS — Z803 Family history of malignant neoplasm of breast: Secondary | ICD-10-CM | POA: Diagnosis not present

## 2022-01-28 DIAGNOSIS — C50919 Malignant neoplasm of unspecified site of unspecified female breast: Secondary | ICD-10-CM | POA: Diagnosis not present

## 2022-01-28 DIAGNOSIS — C50311 Malignant neoplasm of lower-inner quadrant of right female breast: Secondary | ICD-10-CM | POA: Diagnosis not present

## 2022-01-28 NOTE — Progress Notes (Signed)
Accompanied patient and family to initial medical oncology appointment.   Reviewed Breast Cancer treatment handbook.   Care plan summary given to patient.   Patient considering surgical options, genetic testing kit sent to lab. ?

## 2022-01-29 ENCOUNTER — Telehealth: Payer: Self-pay | Admitting: Plastic Surgery

## 2022-01-29 NOTE — Telephone Encounter (Signed)
Spoke to [place] and she stated that she wanted to f/u with Dr. Marla Roe about scheduling patient. Dr. Peyton Najjar wants to confirm SX date.  ? ?Thanks! ?

## 2022-01-29 NOTE — Telephone Encounter (Signed)
Received a call from Sherri at Dr. Deniece Ree office [facility]) to follow up on a conversation Dr. Peyton Najjar had with Dr. Marla Roe about adding patient to the schedule for Tuesday, 5/16 for breast. Please advise Sherri at [phone]. ?

## 2022-02-01 ENCOUNTER — Encounter: Payer: Self-pay | Admitting: Oncology

## 2022-02-01 NOTE — Progress Notes (Signed)
? ?Hematology/Oncology Consult note ?[place] ?[phone] [phone] ? ?Patient Care Team: ?Venita Lick, NP as PCP - General (Nurse Practitioner) ?Theodore Demark, RN as Oncology Nurse Navigator ?Daiva Huge, RN as Oncology Nurse Navigator  ? ?Name of the patient: Erika Figueroa  ?[id number]  ?Oct 12, 1980  ? ? 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  ?Referring physician-Alicia Copland, PA ? ?Date of visit: 02/01/22 ? ? ?History of presenting illness- Patient is a 42 year old female who underwent a diagnostic bilateral mammogram in April 2023 after she self palpated a right breast lump.  Mammogram and ultrasound showed numerous suspicious masses spanning the 3:00 axis in the right breast anterior to posterior depth 6 to 7 cm mammographically.  2 of the largest masses measured 2.3 x 1 x 1 cm and the other 1 measuring 1.4 x 1.3 x 0.8 cm.  No suspicious right axillary adenopathy.  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  Has seen Dr. Peyton Najjar and plan is to proceed with lumpectomy and sentinel lymph node biopsy ? ?Menarche at 75, she is currently premenopausal.  She is G2 P2 and currently does not use any birth control.  No abnormal breast biopsies or mammograms in the past.  Family history significant for her sister with possible DCIS but was tested for BRCA and was negative ? ?ECOG PS- 0 ? ?Pain scale- 0 ? ? ?Review of systems- Review of Systems  ?Constitutional:  Negative for chills, fever, malaise/fatigue and weight loss.  ?HENT:  Negative for congestion, ear discharge and nosebleeds.   ?Eyes:  Negative for blurred vision.  ?Respiratory:  Negative for cough, hemoptysis, sputum production, shortness of breath and wheezing.   ?Cardiovascular:  Negative for chest pain, palpitations, orthopnea and claudication.  ?Gastrointestinal:   Negative for abdominal pain, blood in stool, constipation, diarrhea, heartburn, melena, nausea and vomiting.  ?Genitourinary:  Negative for dysuria, flank pain, frequency, hematuria and urgency.  ?Musculoskeletal:  Negative for back pain, joint pain and myalgias.  ?Skin:  Negative for rash.  ?Neurological:  Negative for dizziness, tingling, focal weakness, seizures, weakness and headaches.  ?Endo/Heme/Allergies:  Does not bruise/bleed easily.  ?Psychiatric/Behavioral:  Negative for depression and suicidal ideas. The patient does not have insomnia.   ? ?No Known Allergies ? ?Patient Active Problem List  ? Diagnosis Date Noted  ? Mass of upper inner quadrant of right breast 12/11/2021  ? Encounter for sterilization 11/13/2017  ? 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 VAGINITIS 12/26/2009  ? OVERWEIGHT 12/04/2009  ? HYPERTENSION 12/04/2009  ? ALLERGIC RHINITIS 12/04/2009  ? FATIGUE 12/04/2009  ? ? ? ?Past Medical History:  ?Diagnosis Date  ? Hypertension   ? ? ? ?Past Surgical History:  ?Procedure Laterality Date  ? TUBAL LIGATION Bilateral 11/13/2017  ? Procedure: POST PARTUM TUBAL LIGATION;  Surgeon: Will Bonnet, MD;  Location: [facility];  Service: Gynecology;  Laterality: Bilateral;  ? ? ?Social History  ? ?Socioeconomic History  ? Marital status: Married  ?  Spouse name: Reggie  ? Number of children: 1  ? Years of education: Not on file  ? Highest education level: Not on file  ?Occupational History  ? Occupation: Administrator  ?Tobacco Use  ? Smoking status: Never  ? Smokeless tobacco: Never  ?Vaping Use  ? Vaping Use: Never used  ?Substance and Sexual Activity  ? Alcohol use: No  ?  Comment: prior to positive ICON  ? Drug use: No  ? Sexual activity: Yes  ?  Partners: Male  ?  Birth control/protection: Surgical  ?  Comment: Tubal Ligation  ?Other Topics Concern  ? Not on file  ?Social History Narrative  ? Not on file  ? ?Social Determinants of Health  ? ?Financial Resource Strain: Not on file  ?Food Insecurity: Not on  file  ?Transportation Needs: Not on file  ?Physical Activity: Not on file  ?Stress: Not on file  ?Social Connections: Not on file  ?Intimate Partner Violence: Not on file  ? ?  ?Family History  ?Problem Relation Age of Onset  ? Diabetes Mother   ? ALS Mother   ? Hypertension Father   ? Alzheimer's disease Father   ? Hyperlipidemia Father   ? Dementia Father   ? Hypothyroidism Sister   ? Breast cancer Sister 50  ?     no mass, but did hormone tx  ? Hyperlipidemia Brother   ? Hypertension Brother   ? Diabetes Maternal Aunt   ? Breast cancer Cousin   ?     second paternal cousin  ? ? ? ?Current Outpatient Medications:  ?  losartan (COZAAR) 100 MG tablet, Take 100 mg by mouth daily. (Patient not taking: Reported on 01/27/2022), Disp: , Rfl:  ? ? ?Physical exam:  ?Vitals:  ? 01/28/22 1322  ?BP: (!) 161/106  ?Pulse: (!) 121  ?Resp: 18  ?Temp: 98.5 ?F (36.9 ?C)  ?SpO2: 99%  ?Weight: 221 lb 4.8 oz (100.4 kg)  ?Height: 6' (1.829 m)  ? ?Physical Exam ?Constitutional:   ?   General: She is not in acute distress. ?Cardiovascular:  ?   Rate and Rhythm: Normal rate and regular rhythm.  ?   Heart sounds: Normal heart sounds.  ?Pulmonary:  ?   Effort: Pulmonary effort is normal.  ?   Breath sounds: Normal breath sounds.  ?Abdominal:  ?   General: Bowel sounds are normal.  ?   Palpations: Abdomen is soft.  ?Skin: ?   General: Skin is warm and dry.  ?Neurological:  ?   Mental Status: She is alert and oriented to person, place, and time.  ?  ?Breast exam: There is induration and bruising noted at the site of her recent right breast biopsy.  It is difficult to ascertain the actual size of the mass.  No palpable masses in the left breast.  No palpable bilateral axillary adenopathy. ? ? ? ?  Latest Ref Rng & Units 05/26/2017  ? 11:07 AM  ?CMP  ?Glucose 65 - 99 mg/dL 98    ?BUN 6 - 20 mg/dL 6    ?Creatinine 0.57 - 1.00 mg/dL 0.65    ?Sodium 134 - 144 mmol/L 140    ?Potassium 3.5 - 5.2 mmol/L 3.7    ?Chloride 96 - 106 mmol/L 106    ?CO2 20 -  29 mmol/L 17    ?Calcium 8.7 - 10.2 mg/dL 9.4    ?Total Protein 6.0 - 8.5 g/dL 6.7    ?Total Bilirubin 0.0 - 1.2 mg/dL 0.5    ?Alkaline Phos 39 - 117 IU/L 79    ?AST 0 - 40 IU/L 20    ?ALT 0 - 32 IU/L 14    ? ? ?  Latest Ref Rng & Units 05/07/2021  ? 10:21 AM  ?CBC  ?WBC 3.4 - 10.8 x10E3/uL 7.7    ?Hemoglobin 11.1 - 15.9 g/dL 14.8    ?Hematocrit  34.0 - 46.6 % 43.8    ?Platelets 150 - 450 x10E3/uL 288    ? ? ?No images are attached to the encounter. ? ?MM CLIP PLACEMENT RIGHT ? ?Result Date: 01/19/2022 ?CLINICAL DATA:  Status post ultrasound-guided core biopsy right breast masses. EXAM: 3D DIAGNOSTIC right MAMMOGRAM POST ULTRASOUND BIOPSY COMPARISON:  Previous exam(s). FINDINGS: A: 3D Mammographic images were obtained following ultrasound guided biopsy of mass at right breast 3 o'clock 2 cm from nipple. The coil biopsy marking clip is in expected position at the site of biopsy. B: 3D Mammographic images were obtained following ultrasound guided biopsy of mass at right breast 3 o'clock 5 cm from nipple. The heart biopsy marking clip is in expected position at the site of biopsy. IMPRESSION: A: Appropriate positioning of the coil shaped biopsy marking clip at the site of biopsy in the mass at right breast 3 o'clock 2 cm from nipple. B: Appropriate positioning of the Elnoria Howard shaped biopsy marking clip at the site of biopsy in the mass at 3 o'clock 5 cm from nipple. Final Assessment: Post Procedure Mammograms for Marker Placement Electronically Signed   By: Abelardo Diesel M.D.   On: 01/19/2022 08:58 ? ?[place] RT BREAST BX W LOC DEV 1ST LESION IMG BX SPEC US GUIDE ? ?Addendum Date: 01/23/2022   ?ADDENDUM REPORT: 01/23/2022 08:11 ADDENDUM: A. Pathology revealed BREAST, RIGHT, 3 O'CLOCK 2 CM FROM NIPPLE (COIL CLIP); ULTRASOUND-GUIDED CORE BIOPSY: DUCTAL CARCINOMA IN SITU (DCIS), LOW GRADE, IN A FRAGMENTED SAMPLE FROM A COMPLEX LESION WITH CYSTIC CHANGE AND SCLEROSIS. This was found to be concordant by Dr. Abelardo Diesel. B. Pathology  revealed BREAST, RIGHT, 3 O'CLOCK 5 CM FROM NIPPLE (HEART CLIP); ULTRASOUND-GUIDED CORE BIOPSY: COMPLEX PAPILLARY NEOPLASM, AT LEAST DCIS, LOW TO INTERMEDIATE GRADE. This was found to be concordant by Dr.

## 2022-02-03 ENCOUNTER — Encounter: Payer: Self-pay | Admitting: Plastic Surgery

## 2022-02-03 ENCOUNTER — Inpatient Hospital Stay (HOSPITAL_BASED_OUTPATIENT_CLINIC_OR_DEPARTMENT_OTHER): Payer: BC Managed Care – PPO | Admitting: Licensed Clinical Social Worker

## 2022-02-03 ENCOUNTER — Encounter: Payer: Self-pay | Admitting: Obstetrics and Gynecology

## 2022-02-03 ENCOUNTER — Ambulatory Visit: Payer: BC Managed Care – PPO | Admitting: Plastic Surgery

## 2022-02-03 VITALS — BP 170/90 | HR 105 | Ht 72.0 in | Wt 225.5 lb

## 2022-02-03 DIAGNOSIS — C50911 Malignant neoplasm of unspecified site of right female breast: Secondary | ICD-10-CM

## 2022-02-03 DIAGNOSIS — C50211 Malignant neoplasm of upper-inner quadrant of right female breast: Secondary | ICD-10-CM

## 2022-02-03 DIAGNOSIS — Z803 Family history of malignant neoplasm of breast: Secondary | ICD-10-CM | POA: Diagnosis not present

## 2022-02-03 DIAGNOSIS — N6312 Unspecified lump in the right breast, upper inner quadrant: Secondary | ICD-10-CM

## 2022-02-03 DIAGNOSIS — C50919 Malignant neoplasm of unspecified site of unspecified female breast: Secondary | ICD-10-CM | POA: Insufficient documentation

## 2022-02-03 NOTE — Progress Notes (Signed)
? ?  Patient ID: Erika Figueroa, female    DOB: 1981-01-21, 41 y.o.   MRN: [id number] ? ? ?Chief Complaint  ?Patient presents with  ? Advice Only  ? Breast Cancer  ? ? ?The patient is a 41 year old female here with her husband for consultation for breast reconstruction.  Diagnosed with right-sided breast cancer after an abnormal mammogram and ultrasound.  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  She has not had radiation and does not have diabetes.  She is not a smoker.  She has a family history of breast cancer.  She is 6 feet tall and weighs.  Her preoperative bra size is a D/DD.  At this point she is considering a mastectomy with reconstruction.  Radiation is not planned.  She would like to be around about the same size. ? ? ? ?Review of Systems  ?Constitutional: Negative.   ?Eyes: Negative.   ?Respiratory: Negative.  Negative for chest tightness and shortness of breath.   ?Cardiovascular:  Negative for leg swelling.  ?Gastrointestinal: Negative.   ?Endocrine: Negative.   ?Genitourinary: Negative.   ?Musculoskeletal: Negative.   ?Skin: Negative.   ?Hematological: Negative.   ?Psychiatric/Behavioral: Negative.    ? ?Past Medical History:  ?Diagnosis Date  ? Hypertension   ?  ?Past Surgical History:  ?Procedure Laterality Date  ? TUBAL LIGATION Bilateral 11/13/2017  ? Procedure: POST PARTUM TUBAL LIGATION;  Surgeon: Will Bonnet, MD;  Location: [facility];  Service: Gynecology;  Laterality: Bilateral;  ?  ? ? ?Current Outpatient Medications:  ?  losartan (COZAAR) 100 MG tablet, Take 100 mg by mouth daily., Disp: , Rfl:   ? ?Objective:  ? ?Vitals:  ? 02/03/22 1118  ?BP: (!) 170/90  ?Pulse: (!) 105  ?SpO2: 100%  ? ? ?Physical Exam ?Vitals and nursing note reviewed.  ?Constitutional:   ?   Appearance: Normal appearance.  ?HENT:  ?   Head: Normocephalic and atraumatic.  ?Cardiovascular:  ?   Rate and Rhythm: Normal rate.  ?   Pulses: Normal pulses.   ?Pulmonary:  ?   Effort: Pulmonary effort is normal. No respiratory distress.  ?Abdominal:  ?   General: There is no distension.  ?   Palpations: Abdomen is soft.  ?   Tenderness: There is no abdominal tenderness.  ?Musculoskeletal:     ?   General: Swelling present. No deformity.  ?Skin: ?   General: Skin is warm.  ?   Capillary Refill: Capillary refill takes less than 2 seconds.  ?   Coloration: Skin is not jaundiced.  ?   Findings: Bruising present. No lesion.  ?Neurological:  ?   Mental Status: She is alert and oriented to person, place, and time.  ?Psychiatric:     ?   Mood and Affect: Mood normal.     ?   Behavior: Behavior normal.     ?   Thought Content: Thought content normal.     ?   Judgment: Judgment normal.  ? ? ?Assessment & Plan:  ?Mass of upper inner quadrant of right breast ? ?Malignant neoplasm of right female breast, unspecified estrogen receptor status, unspecified site of breast ([place]) ? ?The options for reconstruction we explained to the patient / family for breast reconstruction.  There are two general categories of reconstruction.  We can reconstruction a breast with implants or use the patient's own tissue.  These were further discussed as listed. ? ?Breast  reconstruction is an optional procedure and eligibility depends on the full spectrum of the health of the patient and any co-morbidities.  More than one surgery is often needed to complete the reconstruction process.  The process can take three to twelve months to complete.  The breasts will not be identical due to many factors such as rib differences, shoulder asymmetry and treatments such as radiation.  The goal is to get the breasts to look normal and symmetrical in clothes.  Scars are a part of surgery and may fade some in time but will always be present under clothes.  Surgery may be an option on the non-cancer breast to achieve more symmetry.  No matter which procedure is chosen there is always the risk of complications and even  failure of the body to heal.  This could result in no breast.   ? ?The options for reconstruction include:  ?1. Placement of a tissue expander with Acellular dermal matrix. When the expander is the desired size surgery is performed to remove the expander and place an implant.  In some cases the implant can be placed without an expander.  ?2. Autologous reconstruction can include using a muscle or tissue from another area of the body to create a breast.  ?3. Combined procedures (ie. latissismus dorsi flap) can be done with an expander / implant placed under the muscle. ?  ?The risks, benefits, scars and recovery time were discussed for each of the above. Risks include bleeding, infection, hematoma, seroma, scarring, pain, wound healing complications, flap loss, fat necrosis, capsular contracture, need for implant removal, donor site complications, bulge, hernia, umbilical necrosis, need for urgent reoperation, and need for dressing changes.  ? ?The procedure the patient selected / that was best for the patient, was then discussed in further detail.  Total time: 45 minutes. This includes time spent with the patient during the visit as well as time spent before and after the visit reviewing the chart, documenting the encounter, making phone calls and reviewing studies.  ? ?I have spoken with Dr. Windell Moment and we agreed to the following.  Right breast immediate mastectomy with reconstruction with expander and Flex HD.  The left breast can be made symmetric at the time of the implant exchange.  I will plan on calling the patient next week to make sure she does not have any additional questions in the meantime Dr. Windell Moment and I will start looking for a time to coordinate the surgery. ? ?Pictures were obtained of the patient and placed in the chart with the patient's or guardian's permission. ? ? ?Erika Lofty Chue Berkovich, DO ?

## 2022-02-03 NOTE — Progress Notes (Signed)
REFERRING PROVIDER: ?Rao, Archana C, MD ?[address] ?[place],  [place] [address] ? ?PRIMARY PROVIDER:  ?No primary care provider on file. ? ?PRIMARY REASON FOR VISIT:  ?1. Malignant neoplasm of right female breast, unspecified estrogen receptor status, unspecified site of breast ([facility])   ?2. Family history of breast cancer   ? ?I connected with Erika Figueroa on 02/03/2022 at 2:30 PM EDT by MyChart video conference and verified that I am speaking with the correct person using two identifiers.  ?  ?Patient location: home ?Provider location: [facility] ? ? ?HISTORY OF PRESENT ILLNESS:   ?Erika Figueroa, a 41 y.o. female, was seen for a [facility] cancer genetics consultation at the request of Dr. Rao due to a personal and family history of breast cancer.  Erika Figueroa presents to clinic today to discuss the possibility of a hereditary predisposition to cancer, genetic testing, and to further clarify her future cancer risks, as well as potential cancer risks for family members.  ? ? ?CANCER HISTORY:  ?In 2023, at the age of 41, Erika Figueroa was diagnosed with DCIS of the right breast. The treatment plan currently includes lumpectomy.  ? ? ?Oncology History  ? No history exists.  ? ? ? ?RISK FACTORS:  ?Menarche was at age 12.  ?First live birth at age 32.  ?OCP use for approximately several years. ?Ovaries intact: yes.  ?Hysterectomy: no.  ?Menopausal status: premenopausal.  ?HRT use: 0 years. ?Colonoscopy: no; not examined. ? ?Past Medical History:  ?Diagnosis Date  ? Hypertension   ? ? ?Past Surgical History:  ?Procedure Laterality Date  ? TUBAL LIGATION Bilateral 11/13/2017  ? Procedure: POST PARTUM TUBAL LIGATION;  Surgeon: Jackson, Stephen D, MD;  Location: [facility];  Service: Gynecology;  Laterality: Bilateral;  ? ? ?FAMILY HISTORY:  ?We obtained a detailed, 4-generation family history.  Significant diagnoses are listed below: ?Family History  ?Problem Relation Age of Onset  ? Diabetes Mother   ? ALS Mother   ?  Hypertension Father   ? Alzheimer's disease Father   ? Hyperlipidemia Father   ? Dementia Father   ? Hypothyroidism Sister   ? Breast cancer Sister 45  ?     no mass, but did hormone tx  ? Hyperlipidemia Brother   ? Hypertension Brother   ? Diabetes Maternal Aunt   ? Breast cancer Cousin   ?     second paternal cousin  ? ?Erika Figueroa has 1 son, 4 and 1 daughter, 8. She has 1 sister and 1 brother. Her sister had "breast cancer cells" at 45 which she had lumpectomy for, no radiation, and is taking antiestrogen therapy. She also had negative genetic testing reportedly.  ? ?Erika Figueroa's mother died at 62. Patient is unaware of cancer diagnoses on this side of the family. ? ?Erika Figueroa's father died at 68 of Alzheimer's. No known cancers on this side of the family either.  ? ?Erika Figueroa is unaware of previous family history of genetic testing for hereditary cancer risks. There is no reported Ashkenazi Jewish ancestry. There is no known consanguinity. ? ? ? ?GENETIC COUNSELING ASSESSMENT: Erika Figueroa is a 41 y.o. female with a personal history of DCIS which is somewhat suggestive of a hereditary cancer syndrome and predisposition to cancer. We, therefore, discussed and recommended the following at today's visit.  ? ?DISCUSSION: We discussed that approximately 10% of breast cancer is hereditary. Most cases of hereditary breast cancer are associated with BRCA1/BRCA2 genes, although there   are other genes associated with hereditary cancer as well. Cancers and risks are gene specific. We discussed that testing is beneficial for several reasons including knowing about cancer risks, identifying potential screening and risk-reduction options that may be appropriate, and to understand if other family members could be at risk for cancer and allow them to undergo genetic testing.  ? ?We reviewed the characteristics, features and inheritance patterns of hereditary cancer syndromes. We also discussed genetic testing, including the  appropriate family members to test, the process of testing, insurance coverage and turn-around-time for results. We discussed the implications of a negative, positive and/or variant of uncertain significant result. We recommended Erika Figueroa pursue genetic testing for the [facility] BRCAPlus+CustomNext+RNA gene panel.  ? ?Based on Erika Figueroa's personal and family history of cancer, she meets medical criteria for genetic testing. Despite that she meets criteria, she may still have an out of pocket cost. We discussed that if her out of pocket cost for testing is over $100, the laboratory will call and confirm whether she wants to proceed with testing.  If the out of pocket cost of testing is less than $100 she will be billed by the genetic testing laboratory.  ? ?PLAN: After considering the risks, benefits, and limitations, Erika Figueroa provided informed consent to pursue genetic testing and the blood sample was sent to [facility] for analysis of the BRCAPlus+CustomNext+RNA panel. Results should be available within approximately 1 weeks' time (due date is 5/22), at which point they will be disclosed by telephone to Erika Figueroa, as will any additional recommendations warranted by these results. Erika Figueroa will receive a summary of her genetic counseling visit and a copy of her results once available. This information will also be available in Epic.  ? ?Erika Figueroa's questions were answered to her satisfaction today. Our contact information was provided should additional questions or concerns arise. Thank you for the referral and allowing us to share in the care of your patient.  ? ?Brianna Cowan, MS, LCGC ?Genetic Counselor ?[email or web address] ?Phone: [phone] ? ?The patient was seen for a total of 25 minutes in virtual genetic counseling.  Dr. Finnegan was available for discussion regarding this case.  ? ?_______________________________________________________________________ ?For Office Staff:   ?Number of people involved in session: 1 ?Was an Intern/ student involved with case: no ? ?

## 2022-02-06 ENCOUNTER — Ambulatory Visit: Payer: Self-pay | Admitting: General Surgery

## 2022-02-06 ENCOUNTER — Other Ambulatory Visit: Payer: Self-pay | Admitting: General Surgery

## 2022-02-06 ENCOUNTER — Other Ambulatory Visit: Payer: Self-pay

## 2022-02-06 ENCOUNTER — Other Ambulatory Visit
Admission: RE | Admit: 2022-02-06 | Discharge: 2022-02-06 | Disposition: A | Discharge status: Home / Self Care | Payer: BC Managed Care – PPO | Source: Ambulatory Visit | Attending: General Surgery | Admitting: General Surgery

## 2022-02-06 ENCOUNTER — Telehealth: Payer: Self-pay | Admitting: Licensed Clinical Social Worker

## 2022-02-06 DIAGNOSIS — D0511 Intraductal carcinoma in situ of right breast: Secondary | ICD-10-CM

## 2022-02-06 DIAGNOSIS — Z01812 Encounter for preprocedural laboratory examination: Secondary | ICD-10-CM

## 2022-02-06 DIAGNOSIS — C50311 Malignant neoplasm of lower-inner quadrant of right female breast: Secondary | ICD-10-CM

## 2022-02-06 HISTORY — DX: Headache, unspecified: R51.9

## 2022-02-06 HISTORY — DX: Anxiety disorder, unspecified: F41.9

## 2022-02-06 HISTORY — DX: Malignant (primary) neoplasm, unspecified: C80.1

## 2022-02-06 NOTE — Telephone Encounter (Signed)
Disclosed negative genetic testing on [facility] BRCAPlus panel (STAT testing), these include the 8 genes with highest risk for breast cancer that can be used to help with surgical decision-making. I will call Erika Figueroa again when remainder of testing comes back.

## 2022-02-06 NOTE — Patient Instructions (Addendum)
Your procedure is scheduled on: 02/18/22 - Wednesday Report to the Registration Desk on the 1st floor of the [facility]. To find out your arrival time, please call [phone] between 1PM - 3PM on: 02/17/22 - Tuesday If your arrival time is 6:00 am, do not arrive prior to that time as the [facility] entrance doors do not open until 6:00 am.  REMEMBER: Instructions that are not followed completely may result in serious medical risk, up to and including death; or upon the discretion of your surgeon and anesthesiologist your surgery may need to be rescheduled.  Do not eat food after midnight the night before surgery.  No gum chewing, lozengers or hard candies.  You may however, drink CLEAR liquids up to 2 hours before you are scheduled to arrive for your surgery. Do not drink anything within 2 hours of your scheduled arrival time.  Clear liquids include: - water  - apple juice without pulp - gatorade (not RED colors) - black coffee or tea (Do NOT add milk or creamers to the coffee or tea) Do NOT drink anything that is not on this list.  TAKE THESE MEDICATIONS THE MORNING OF SURGERY WITH A SIP OF WATER: NONE  One week prior to surgery: Stop Anti-inflammatories (NSAIDS) such as Advil, Aleve, Ibuprofen, Motrin, Naproxen, Naprosyn and Aspirin based products such as Excedrin, Goodys Powder, BC Powder.  Stop ANY OVER THE COUNTER supplements until after surgery.  You may take Tylenol if needed for pain up until the day of surgery.  No Alcohol for 24 hours before or after surgery.  No Smoking including e-cigarettes for 24 hours prior to surgery.  No chewable tobacco products for at least 6 hours prior to surgery.  No nicotine patches on the day of surgery.  Do not use any "recreational" drugs for at least a week prior to your surgery.  Please be advised that the combination of cocaine and anesthesia may have negative outcomes, up to and including death. If you test positive for  cocaine, your surgery will be cancelled.  On the morning of surgery brush your teeth with toothpaste and water, you may rinse your mouth with mouthwash if you wish. Do not swallow any toothpaste or mouthwash.  Use CHG Soap or wipes as directed on instruction sheet.  Do not wear jewelry, make-up, hairpins, clips or nail polish.  Do not wear lotions, powders, or perfumes.   Do not shave body from the neck down 48 hours prior to surgery just in case you cut yourself which could leave a site for infection.  Also, freshly shaved skin may become irritated if using the CHG soap.  Contact lenses, hearing aids and dentures may not be worn into surgery.  Do not bring valuables to the hospital. The [facility] is not responsible for any missing/lost belongings or valuables.   Notify your doctor if there is any change in your medical condition (cold, fever, infection).  Wear comfortable clothing (specific to your surgery type) to the hospital.  After surgery, you can help prevent lung complications by doing breathing exercises.  Take deep breaths and cough every 1-2 hours. Your doctor may order a device called an Incentive Spirometer to help you take deep breaths. When coughing or sneezing, hold a pillow firmly against your incision with both hands. This is called "splinting." Doing this helps protect your incision. It also decreases belly discomfort.  If you are being admitted to the hospital overnight, leave your suitcase in the car. After surgery it  may be brought to your room.  If you are being discharged the day of surgery, you will not be allowed to drive home. You will need a responsible adult (18 years or older) to drive you home and stay with you that night.   If you are taking public transportation, you will need to have a responsible adult (18 years or older) with you. Please confirm with your physician that it is acceptable to use public transportation.   Please call the Pre-admissions  Testing Dept. at [phone] if you have any questions about these instructions.  Surgery Visitation Policy:  Patients undergoing a surgery or procedure may have two family members or support persons with them as long as the person is not COVID-19 positive or experiencing its symptoms.   Inpatient Visitation:    Visiting hours are 7 a.m. to 8 p.m. Up to four visitors are allowed at one time in a patient room, including children. The visitors may rotate out with other people during the day. One designated support person (adult) may remain overnight.  

## 2022-02-09 ENCOUNTER — Ambulatory Visit (INDEPENDENT_AMBULATORY_CARE_PROVIDER_SITE_OTHER): Payer: BC Managed Care – PPO | Admitting: Physician Assistant

## 2022-02-09 ENCOUNTER — Encounter: Payer: Self-pay | Admitting: *Deleted

## 2022-02-09 ENCOUNTER — Encounter: Payer: Self-pay | Admitting: Physician Assistant

## 2022-02-09 VITALS — BP 165/88 | HR 87 | Ht 73.0 in | Wt 226.4 lb

## 2022-02-09 DIAGNOSIS — N6312 Unspecified lump in the right breast, upper inner quadrant: Secondary | ICD-10-CM

## 2022-02-09 MED ORDER — HYDROCODONE-ACETAMINOPHEN 5-325 MG PO TABS: 1.0000 | ORAL_TABLET | Freq: Four times a day (QID) | ORAL | 0 refills | Status: AC | PRN

## 2022-02-09 MED ORDER — ONDANSETRON 4 MG PO TBDP: 4.0000 mg | ORAL_TABLET | Freq: Three times a day (TID) | ORAL | 0 refills | Status: DC | PRN

## 2022-02-09 MED ORDER — CEPHALEXIN 500 MG PO CAPS: 500.0000 mg | ORAL_CAPSULE | Freq: Three times a day (TID) | ORAL | 0 refills | Status: AC

## 2022-02-09 MED ORDER — DIAZEPAM 2 MG PO TABS: 2.0000 mg | ORAL_TABLET | Freq: Two times a day (BID) | ORAL | 0 refills | Status: DC | PRN

## 2022-02-09 NOTE — Progress Notes (Signed)
Patient ID: Erika Figueroa, female    DOB: 02/04/1981, 41 y.o.   MRN: [id number]  Chief Complaint  Patient presents with   Pre-op Exam      ICD-10-CM   1. Mass of upper inner quadrant of right breast  N63.12        History of Present Illness: Erika Figueroa is a 41 y.o.  female  with a history of right-sided breast cancer.  She presents for preoperative evaluation for upcoming procedure, right-sided mastectomy with immediate reconstruction using tissue expander and Flex HD, scheduled for 02/18/2022 with Dr. Marla Roe.  The patient has not had problems with anesthesia.  Previous tubal ligation without complication.  She denies any personal or family history of blood clots or clotting disorder.  Denies any personal history of cardiac disease aside from HTN for which she has not been regularly taking her losartan.  No history of MI, CVA, or blood thinner use.  The only other medication that she takes occasionally will be Tylenol for headaches.  She confirms that she is a 77 D/DD and would like to be approximately the same size postoperatively.  She denies any nicotine containing products.  Summary of Previous Visit: She was seen here for consult 02/03/2022 with Dr. Marla Roe.  Preoperative bra size equals D/DD cup.  She is planning to have right-sided mastectomy with Dr. Windell Moment.  Do not suspect that postoperative radiation will be required.  She would like to remain approximately the same size after her reconstruction.  After discussing options with Dr. Marla Roe, like to proceed with implant-based reconstruction using tissue expander and Flex HD.  Job: Dispensing optician, discussed 4 weeks FMLA.  PMH Significant for: Right-sided DCIS, HTN.   Past Medical History: Allergies: No Known Allergies  Current Medications:  Current Outpatient Medications:    losartan (COZAAR) 100 MG tablet, Take 100 mg by mouth daily. (Patient not taking: Reported on 02/06/2022),  Disp: , Rfl:   Past Medical Problems: Past Medical History:  Diagnosis Date   Anxiety    Cancer ([place])    Headache    Hypertension     Past Surgical History: Past Surgical History:  Procedure Laterality Date   BREAST SURGERY     biopsy right   TUBAL LIGATION Bilateral 11/13/2017   Procedure: POST PARTUM TUBAL LIGATION;  Surgeon: Will Bonnet, MD;  Location: [facility] ORS;  Service: Gynecology;  Laterality: Bilateral;    Social History: Social History   Socioeconomic History   Marital status: Married    Spouse name: Reggie   Number of children: 2   Years of education: Not on file   Highest education level: Not on file  Occupational History   Occupation: Administrator  Tobacco Use   Smoking status: Never   Smokeless tobacco: Never  Vaping Use   Vaping Use: Never used  Substance and Sexual Activity   Alcohol use: No    Comment: prior to positive ICON   Drug use: No   Sexual activity: Yes    Partners: Male    Birth control/protection: Surgical    Comment: Tubal Ligation  Other Topics Concern   Not on file  Social History Narrative   Not on file   Social Determinants of Health   Financial Resource Strain: Not on file  Food Insecurity: Not on file  Transportation Needs: Not on file  Physical Activity: Not on file  Stress: Not on file  Social Connections: Not on file  Intimate Partner Violence: Not  on file    Family History: Family History  Problem Relation Age of Onset   Diabetes Mother    ALS Mother    Hypertension Father    Alzheimer's disease Father    Hyperlipidemia Father    Dementia Father    Hypothyroidism Sister    Breast cancer Sister 11       no mass, but did hormone tx   Hyperlipidemia Brother    Hypertension Brother    Diabetes Maternal Aunt    Breast cancer Cousin        second paternal cousin    Review of Systems: ROS Denies recent chest pain, infection, or hospitalization.  Physical Exam: Vital Signs LMP 01/15/2022    Physical Exam Constitutional:      General: Not in acute distress.    Appearance: Normal appearance. Not ill-appearing.  HENT:     Head: Normocephalic and atraumatic.  Eyes:     Pupils: Pupils are equal, round. Cardiovascular:     Rate and Rhythm: Normal rate.    Pulses: Normal pulses.  Pulmonary:     Effort: No respiratory distress or increased work of breathing.  Speaks in full sentences. Abdominal:     General: Abdomen is flat. No distension.   Musculoskeletal: Normal range of motion. No lower extremity swelling or edema. No varicosities. Skin:    General: Skin is warm and dry.     Findings: No erythema or rash.  Neurological:     Mental Status: Alert and oriented to person, place, and time.  Psychiatric:        Mood and Affect: Mood normal.        Behavior: Behavior normal.    Assessment/Plan: The patient is scheduled for right-sided mastectomy with immediate reconstruction using tissue expander and Flex HD with Dr. Marla Roe.  Risks, benefits, and alternatives of procedure discussed, questions answered and consent obtained.    Smoking Status: Non-smoker. Last Mammogram: 12/2021; Results: Numerous masses right breast, no evidence of malignancy on the left.  Caprini Score: 5; Risk Factors include: Personal history of cancer, BMI greater than 25, and length of planned surgery. Recommendation for mechanical prophylaxis. Encourage early ambulation.   Pictures obtained: 02/03/2022  Post-op Rx sent to pharmacy: Norco, Valium, Zofran, Keflex.  Patient was provided with the General Surgical Risk consent document and Pain Medication Agreement prior to their appointment.  They had adequate time to read through the risk consent documents and Pain Medication Agreement. We also discussed them in person together during this preop appointment. All of their questions were answered to their satisfaction.  Recommended calling if they have any further questions.  Risk consent form and Pain  Medication Agreement to be scanned into patient's chart.  The risks that can be encountered with and after placement of a breast expander placement were discussed and include the following but not limited to these: bleeding, infection, delayed healing, anesthesia risks, skin sensation changes, injury to structures including nerves, blood vessels, and muscles which may be temporary or permanent, allergies to tape, suture materials and glues, blood products, topical preparations or injected agents, skin contour irregularities, skin discoloration and swelling, deep vein thrombosis, cardiac and pulmonary complications, pain, which may persist, fluid accumulation, wrinkling of the skin over the expander, changes in nipple or breast sensation, expander leakage or rupture, faulty position of the expander, persistent pain, formation of tight scar tissue around the expander (capsular contracture), possible need for revisional surgery or staged procedures.  Patient understands she will require postoperative drains.  Electronically signed by: Krista Blue, PA-C 02/09/2022 8:09 AM

## 2022-02-10 ENCOUNTER — Encounter: Payer: Self-pay | Admitting: Plastic Surgery

## 2022-02-10 ENCOUNTER — Ambulatory Visit (INDEPENDENT_AMBULATORY_CARE_PROVIDER_SITE_OTHER): Payer: BC Managed Care – PPO | Admitting: Plastic Surgery

## 2022-02-10 DIAGNOSIS — C50911 Malignant neoplasm of unspecified site of right female breast: Secondary | ICD-10-CM | POA: Diagnosis not present

## 2022-02-10 NOTE — Progress Notes (Addendum)
   Subjective:    Patient ID: Erika Figueroa, female    DOB: 1980/10/27, 41 y.o.   MRN: [id number]  The patient is a 41 year old female joining me by phone for further discussion about breast reconstruction.  She has right breast cancer and is planning on undergoing a mastectomy she originally wanted to do immediate reconstruction with expander and Flex HD.  She is now not sure if she wants to do that.  She has an appointment with Dr. Windell Moment on Friday.  She wants to continue to think about it until then and then let me know.  She has had surgery before with a tubal ligation she is otherwise in good health.  Is not planning on radiation at this point.  She feels like it is too much right now to do the reconstruction and go back and forth to [facility] in [place].  Review of Systems  Constitutional: Negative.   Eyes: Negative.   Respiratory: Negative.    Cardiovascular: Negative.   Endocrine: Negative.   Genitourinary: Negative.       Objective:   Physical Exam      Assessment & Plan:     ICD-10-CM   1. Malignant neoplasm of right female breast, unspecified estrogen receptor status, unspecified site of breast ([place])  C50.911         I connected with  ALOIS MINCER on 02/10/22 phone and verified that I am speaking with the correct person using two identifiers. I spent 15 min in the following manner: review of charge, documentation. We spent 5 min in discussion. The patient was at home and I was at the office.   I discussed the limitations of evaluation and management by telemedicine. The patient expressed understanding and agreed to proceed.

## 2022-02-13 ENCOUNTER — Encounter: Payer: Self-pay | Admitting: Urgent Care

## 2022-02-13 ENCOUNTER — Other Ambulatory Visit
Admission: RE | Admit: 2022-02-13 | Discharge: 2022-02-13 | Disposition: A | Discharge status: Home / Self Care | Payer: BC Managed Care – PPO | Source: Ambulatory Visit | Attending: General Surgery | Admitting: General Surgery

## 2022-02-13 DIAGNOSIS — Z01812 Encounter for preprocedural laboratory examination: Secondary | ICD-10-CM | POA: Insufficient documentation

## 2022-02-13 DIAGNOSIS — Z01818 Encounter for other preprocedural examination: Secondary | ICD-10-CM | POA: Diagnosis not present

## 2022-02-13 LAB — BASIC METABOLIC PANEL
Anion gap: 9 (ref 5–15)
BUN: 17 mg/dL (ref 6–20)
CO2: 25 mmol/L (ref 22–32)
Calcium: 9.5 mg/dL (ref 8.9–10.3)
Chloride: 106 mmol/L (ref 98–111)
Creatinine, Ser: 0.93 mg/dL (ref 0.44–1.00)
GFR, Estimated: >60 mL/min (ref >60)
Glucose, Bld: 78 mg/dL (ref 70–99)
Potassium: 3.7 mmol/L (ref 3.5–5.1)
Sodium: 140 mmol/L (ref 135–145)

## 2022-02-13 LAB — CBC
HCT: 42.1 % (ref 36.0–46.0)
Hemoglobin: 13.9 g/dL (ref 12.0–15.0)
MCH: 30.3 pg (ref 26.0–34.0)
MCHC: 33 g/dL (ref 30.0–36.0)
MCV: 91.9 fL (ref 80.0–100.0)
Platelets: 316 10*3/uL (ref 150–400)
RBC: 4.58 MIL/uL (ref 3.87–5.11)
RDW: 14.1 % (ref 11.5–15.5)
WBC: 7.6 10*3/uL (ref 4.0–10.5)
nRBC: 0 % (ref 0.0–0.2)

## 2022-02-17 ENCOUNTER — Telehealth: Payer: Self-pay | Admitting: *Deleted

## 2022-02-17 ENCOUNTER — Telehealth: Payer: Self-pay | Admitting: Plastic Surgery

## 2022-02-17 NOTE — Telephone Encounter (Signed)
Called pt and let her know that pt appt for Erika Figueroa is the same day of surgery. Moved the appt for 6/16 at 3:15. Pt agreeable with this

## 2022-02-17 NOTE — Telephone Encounter (Signed)
Called patient back to advise that I have cancelled Dr. Eusebio Friendly portion of surgery and all the appropriate appts. Advised patient that she is welcome to call our office if she wishes to move forward with reconstruction at  a later date. Patient stated understanding.

## 2022-02-18 ENCOUNTER — Encounter: Payer: Self-pay | Admitting: General Surgery

## 2022-02-18 ENCOUNTER — Ambulatory Visit
Admission: RE | Admit: 2022-02-18 | Discharge: 2022-02-19 | Disposition: A | Discharge status: Home / Self Care | Payer: BC Managed Care – PPO | Attending: General Surgery | Admitting: General Surgery

## 2022-02-18 ENCOUNTER — Encounter
Admission: RE | Disposition: A | Discharge status: Home / Self Care | Payer: Self-pay | Source: Home / Self Care | Attending: General Surgery

## 2022-02-18 ENCOUNTER — Ambulatory Visit: Payer: BC Managed Care – PPO | Admitting: Urgent Care

## 2022-02-18 ENCOUNTER — Other Ambulatory Visit: Payer: Self-pay

## 2022-02-18 ENCOUNTER — Inpatient Hospital Stay: Payer: BC Managed Care – PPO | Admitting: Oncology

## 2022-02-18 ENCOUNTER — Ambulatory Visit
Admission: RE | Admit: 2022-02-18 | Discharge: 2022-02-18 | Disposition: A | Discharge status: Home / Self Care | Payer: BC Managed Care – PPO | Source: Ambulatory Visit | Attending: General Surgery | Admitting: General Surgery

## 2022-02-18 ENCOUNTER — Encounter
Admission: RE | Admit: 2022-02-18 | Discharge: 2022-02-18 | Disposition: A | Discharge status: Home / Self Care | Payer: BC Managed Care – PPO | Source: Ambulatory Visit | Attending: General Surgery | Admitting: General Surgery

## 2022-02-18 DIAGNOSIS — Z01812 Encounter for preprocedural laboratory examination: Secondary | ICD-10-CM

## 2022-02-18 DIAGNOSIS — E669 Obesity, unspecified: Secondary | ICD-10-CM | POA: Diagnosis not present

## 2022-02-18 DIAGNOSIS — I10 Essential (primary) hypertension: Secondary | ICD-10-CM | POA: Diagnosis not present

## 2022-02-18 DIAGNOSIS — F419 Anxiety disorder, unspecified: Secondary | ICD-10-CM | POA: Insufficient documentation

## 2022-02-18 DIAGNOSIS — C50919 Malignant neoplasm of unspecified site of unspecified female breast: Secondary | ICD-10-CM | POA: Diagnosis not present

## 2022-02-18 DIAGNOSIS — D0511 Intraductal carcinoma in situ of right breast: Secondary | ICD-10-CM | POA: Diagnosis not present

## 2022-02-18 DIAGNOSIS — C50311 Malignant neoplasm of lower-inner quadrant of right female breast: Secondary | ICD-10-CM

## 2022-02-18 DIAGNOSIS — Z79899 Other long term (current) drug therapy: Secondary | ICD-10-CM | POA: Insufficient documentation

## 2022-02-18 DIAGNOSIS — Z6829 Body mass index (BMI) 29.0-29.9, adult: Secondary | ICD-10-CM | POA: Diagnosis not present

## 2022-02-18 HISTORY — PX: MASTECTOMY W/ SENTINEL NODE BIOPSY: SHX2001

## 2022-02-18 HISTORY — PX: MASTECTOMY: SHX3

## 2022-02-18 LAB — POCT PREGNANCY, URINE: Preg Test, Ur: NEGATIVE

## 2022-02-18 SURGERY — MASTECTOMY WITH SENTINEL LYMPH NODE BIOPSY
Anesthesia: General | Site: Breast | Laterality: Right

## 2022-02-18 MED ORDER — HYDRALAZINE HCL 20 MG/ML IJ SOLN
INTRAMUSCULAR | Status: AC
  Filled 2022-02-18: qty 1

## 2022-02-18 MED ORDER — METHYLENE BLUE 1 % INJ SOLN
INTRAVENOUS | Status: AC
  Filled 2022-02-18: qty 10

## 2022-02-18 MED ORDER — KETAMINE HCL 10 MG/ML IJ SOLN
INTRAMUSCULAR | Status: DC | PRN
  Administered 2022-02-18: 20 mg via INTRAVENOUS
  Administered 2022-02-18: 30 mg via INTRAVENOUS

## 2022-02-18 MED ORDER — HEPARIN SODIUM (PORCINE) 5000 UNIT/ML IJ SOLN
INTRAMUSCULAR | Status: AC
  Administered 2022-02-18: 5000 [IU] via SUBCUTANEOUS
  Filled 2022-02-18: qty 1

## 2022-02-18 MED ORDER — ACETAMINOPHEN 10 MG/ML IV SOLN: 1000.0000 mg | Freq: Once | INTRAVENOUS | Status: DC | PRN

## 2022-02-18 MED ORDER — ONDANSETRON 4 MG PO TBDP
4.0000 mg | ORAL_TABLET | Freq: Four times a day (QID) | ORAL | Status: DC | PRN
  Administered 2022-02-18: 4 mg via ORAL
  Filled 2022-02-18: qty 1

## 2022-02-18 MED ORDER — CEFAZOLIN SODIUM-DEXTROSE 2-4 GM/100ML-% IV SOLN
INTRAVENOUS | Status: AC
  Filled 2022-02-18: qty 100

## 2022-02-18 MED ORDER — OXYCODONE HCL 5 MG PO TABS: 5.0000 mg | ORAL_TABLET | Freq: Once | ORAL | Status: DC | PRN

## 2022-02-18 MED ORDER — STERILE WATER FOR IRRIGATION IR SOLN
Status: DC | PRN
  Administered 2022-02-18: 1000 mL

## 2022-02-18 MED ORDER — OXYCODONE HCL 5 MG/5ML PO SOLN: 5.0000 mg | Freq: Once | ORAL | Status: DC | PRN

## 2022-02-18 MED ORDER — CHLORHEXIDINE GLUCONATE 0.12 % MT SOLN
OROMUCOSAL | Status: AC
  Administered 2022-02-18: 15 mL via OROMUCOSAL
  Filled 2022-02-18: qty 15

## 2022-02-18 MED ORDER — ACETAMINOPHEN 10 MG/ML IV SOLN
INTRAVENOUS | Status: DC | PRN
  Administered 2022-02-18: 1000 mg via INTRAVENOUS

## 2022-02-18 MED ORDER — FENTANYL CITRATE (PF) 100 MCG/2ML IJ SOLN
INTRAMUSCULAR | Status: AC
  Filled 2022-02-18: qty 2

## 2022-02-18 MED ORDER — DEXAMETHASONE SODIUM PHOSPHATE 10 MG/ML IJ SOLN
INTRAMUSCULAR | Status: AC
Start: 2022-02-18 — End: ?
  Filled 2022-02-18: qty 1

## 2022-02-18 MED ORDER — METOPROLOL TARTRATE 5 MG/5ML IV SOLN
INTRAVENOUS | Status: AC
  Administered 2022-02-18: 5 mg via INTRAVENOUS
  Filled 2022-02-18: qty 5

## 2022-02-18 MED ORDER — CEFAZOLIN SODIUM-DEXTROSE 2-4 GM/100ML-% IV SOLN: 2.0000 g | INTRAVENOUS | Status: DC

## 2022-02-18 MED ORDER — TECHNETIUM TC 99M TILMANOCEPT KIT
1.0000 | PACK | Freq: Once | INTRAVENOUS | Status: AC
  Administered 2022-02-18: 1.05 via INTRADERMAL

## 2022-02-18 MED ORDER — ONDANSETRON HCL 4 MG/2ML IJ SOLN
INTRAMUSCULAR | Status: DC | PRN
  Administered 2022-02-18: 4 mg via INTRAVENOUS

## 2022-02-18 MED ORDER — DIAZEPAM 2 MG PO TABS
2.0000 mg | ORAL_TABLET | Freq: Two times a day (BID) | ORAL | Status: DC | PRN
Start: 2022-02-18 — End: 2022-02-19

## 2022-02-18 MED ORDER — PROPOFOL 10 MG/ML IV BOLUS
INTRAVENOUS | Status: AC
  Filled 2022-02-18: qty 40

## 2022-02-18 MED ORDER — BUPIVACAINE-EPINEPHRINE (PF) 0.5% -1:200000 IJ SOLN
INTRAMUSCULAR | Status: DC | PRN
  Administered 2022-02-18: 50 mL via INTRAMUSCULAR

## 2022-02-18 MED ORDER — LACTATED RINGERS IV SOLN: INTRAVENOUS | Status: DC

## 2022-02-18 MED ORDER — FENTANYL CITRATE (PF) 100 MCG/2ML IJ SOLN
INTRAMUSCULAR | Status: DC | PRN
  Administered 2022-02-18: 50 ug via INTRAVENOUS
  Administered 2022-02-18 (×2): 25 ug via INTRAVENOUS

## 2022-02-18 MED ORDER — MIDAZOLAM HCL 2 MG/2ML IJ SOLN
INTRAMUSCULAR | Status: AC
Start: 2022-02-18 — End: ?
  Filled 2022-02-18: qty 2

## 2022-02-18 MED ORDER — HYDROMORPHONE HCL 1 MG/ML IJ SOLN
INTRAMUSCULAR | Status: DC | PRN
  Administered 2022-02-18 (×2): .5 mg via INTRAVENOUS

## 2022-02-18 MED ORDER — LIDOCAINE HCL (CARDIAC) PF 100 MG/5ML IV SOSY
PREFILLED_SYRINGE | INTRAVENOUS | Status: DC | PRN
  Administered 2022-02-18: 100 mg via INTRAVENOUS

## 2022-02-18 MED ORDER — CEFAZOLIN SODIUM-DEXTROSE 2-4 GM/100ML-% IV SOLN
2.0000 g | INTRAVENOUS | Status: AC
  Administered 2022-02-18: 2 g via INTRAVENOUS

## 2022-02-18 MED ORDER — GABAPENTIN 300 MG PO CAPS
300.0000 mg | ORAL_CAPSULE | Freq: Two times a day (BID) | ORAL | Status: DC
  Administered 2022-02-18 – 2022-02-19 (×2): 300 mg via ORAL
  Filled 2022-02-18 (×2): qty 1

## 2022-02-18 MED ORDER — PHENYLEPHRINE HCL-NACL 20-0.9 MG/250ML-% IV SOLN
INTRAVENOUS | Status: AC
  Filled 2022-02-18: qty 250

## 2022-02-18 MED ORDER — DEXMEDETOMIDINE (PRECEDEX) IN NS 20 MCG/5ML (4 MCG/ML) IV SYRINGE
PREFILLED_SYRINGE | INTRAVENOUS | Status: DC | PRN
  Administered 2022-02-18: 6 ug via INTRAVENOUS
  Administered 2022-02-18 (×2): 10 ug via INTRAVENOUS
  Administered 2022-02-18: 4 ug via INTRAVENOUS

## 2022-02-18 MED ORDER — PHENYLEPHRINE 80 MCG/ML (10ML) SYRINGE FOR IV PUSH (FOR BLOOD PRESSURE SUPPORT)
PREFILLED_SYRINGE | INTRAVENOUS | Status: DC | PRN
  Administered 2022-02-18 (×2): 160 ug via INTRAVENOUS

## 2022-02-18 MED ORDER — MORPHINE SULFATE (PF) 4 MG/ML IV SOLN
4.0000 mg | INTRAVENOUS | Status: DC | PRN
  Administered 2022-02-18: 4 mg via INTRAVENOUS
  Filled 2022-02-18: qty 1

## 2022-02-18 MED ORDER — KETAMINE HCL 50 MG/5ML IJ SOSY
PREFILLED_SYRINGE | INTRAMUSCULAR | Status: AC
  Filled 2022-02-18: qty 5

## 2022-02-18 MED ORDER — HYDROCODONE-ACETAMINOPHEN 5-325 MG PO TABS
1.0000 | ORAL_TABLET | ORAL | Status: DC | PRN
  Administered 2022-02-19: 2 via ORAL
  Filled 2022-02-18: qty 2

## 2022-02-18 MED ORDER — LIDOCAINE HCL (PF) 2 % IJ SOLN
INTRAMUSCULAR | Status: AC
  Filled 2022-02-18: qty 5

## 2022-02-18 MED ORDER — METHOCARBAMOL 500 MG PO TABS: 500.0000 mg | ORAL_TABLET | Freq: Four times a day (QID) | ORAL | Status: DC | PRN

## 2022-02-18 MED ORDER — ORAL CARE MOUTH RINSE: 15.0000 mL | Freq: Once | OROMUCOSAL | Status: AC

## 2022-02-18 MED ORDER — DEXAMETHASONE SODIUM PHOSPHATE 10 MG/ML IJ SOLN
INTRAMUSCULAR | Status: DC | PRN
  Administered 2022-02-18: 10 mg via INTRAVENOUS

## 2022-02-18 MED ORDER — PROPOFOL 500 MG/50ML IV EMUL
INTRAVENOUS | Status: DC | PRN
  Administered 2022-02-18: 25 ug/kg/min via INTRAVENOUS

## 2022-02-18 MED ORDER — HYDROMORPHONE HCL 1 MG/ML IJ SOLN
INTRAMUSCULAR | Status: AC
  Filled 2022-02-18: qty 1

## 2022-02-18 MED ORDER — ACETAMINOPHEN 10 MG/ML IV SOLN
INTRAVENOUS | Status: AC
  Filled 2022-02-18: qty 100

## 2022-02-18 MED ORDER — PROPOFOL 10 MG/ML IV BOLUS
INTRAVENOUS | Status: AC
  Filled 2022-02-18: qty 20

## 2022-02-18 MED ORDER — BUPIVACAINE LIPOSOME 1.3 % IJ SUSP
INTRAMUSCULAR | Status: AC
  Filled 2022-02-18: qty 20

## 2022-02-18 MED ORDER — ONDANSETRON HCL 4 MG/2ML IJ SOLN: 4.0000 mg | Freq: Once | INTRAMUSCULAR | Status: DC | PRN

## 2022-02-18 MED ORDER — CELECOXIB 200 MG PO CAPS
200.0000 mg | ORAL_CAPSULE | Freq: Two times a day (BID) | ORAL | Status: DC
  Administered 2022-02-18 – 2022-02-19 (×2): 200 mg via ORAL
  Filled 2022-02-18 (×2): qty 1

## 2022-02-18 MED ORDER — ONDANSETRON HCL 4 MG/2ML IJ SOLN
INTRAMUSCULAR | Status: AC
  Filled 2022-02-18: qty 2

## 2022-02-18 MED ORDER — ONDANSETRON HCL 4 MG/2ML IJ SOLN
4.0000 mg | Freq: Four times a day (QID) | INTRAMUSCULAR | Status: DC | PRN
  Administered 2022-02-19: 4 mg via INTRAVENOUS
  Filled 2022-02-18: qty 2

## 2022-02-18 MED ORDER — BUPIVACAINE-EPINEPHRINE (PF) 0.25% -1:200000 IJ SOLN
INTRAMUSCULAR | Status: AC
  Filled 2022-02-18: qty 30

## 2022-02-18 MED ORDER — FAMOTIDINE 20 MG PO TABS: 20.0000 mg | ORAL_TABLET | Freq: Once | ORAL | Status: AC

## 2022-02-18 MED ORDER — METOPROLOL TARTRATE 5 MG/5ML IV SOLN: 5.0000 mg | Freq: Once | INTRAVENOUS | Status: AC

## 2022-02-18 MED ORDER — ENOXAPARIN SODIUM 40 MG/0.4ML IJ SOSY
40.0000 mg | PREFILLED_SYRINGE | INTRAMUSCULAR | Status: DC
  Administered 2022-02-19: 40 mg via SUBCUTANEOUS
  Filled 2022-02-18: qty 0.4

## 2022-02-18 MED ORDER — CHLORHEXIDINE GLUCONATE 0.12 % MT SOLN: 15.0000 mL | Freq: Once | OROMUCOSAL | Status: AC

## 2022-02-18 MED ORDER — BUPIVACAINE-EPINEPHRINE (PF) 0.5% -1:200000 IJ SOLN
INTRAMUSCULAR | Status: AC
  Filled 2022-02-18: qty 30

## 2022-02-18 MED ORDER — PHENYLEPHRINE HCL-NACL 20-0.9 MG/250ML-% IV SOLN
INTRAVENOUS | Status: DC | PRN
  Administered 2022-02-18: 20 ug/min via INTRAVENOUS

## 2022-02-18 MED ORDER — FAMOTIDINE 20 MG PO TABS
ORAL_TABLET | ORAL | Status: AC
  Administered 2022-02-18: 20 mg via ORAL
  Filled 2022-02-18: qty 1

## 2022-02-18 MED ORDER — FENTANYL CITRATE (PF) 100 MCG/2ML IJ SOLN
25.0000 ug | INTRAMUSCULAR | Status: DC | PRN
  Administered 2022-02-18 (×2): 25 ug via INTRAVENOUS

## 2022-02-18 MED ORDER — HYDRALAZINE HCL 20 MG/ML IJ SOLN
5.0000 mg | Freq: Once | INTRAMUSCULAR | Status: AC
  Administered 2022-02-18: 5 mg via INTRAVENOUS

## 2022-02-18 MED ORDER — MIDAZOLAM HCL 2 MG/2ML IJ SOLN
INTRAMUSCULAR | Status: DC | PRN
  Administered 2022-02-18: 2 mg via INTRAVENOUS

## 2022-02-18 MED ORDER — HEPARIN SODIUM (PORCINE) 5000 UNIT/ML IJ SOLN: 5000.0000 [IU] | Freq: Once | INTRAMUSCULAR | Status: AC

## 2022-02-18 MED ORDER — PROPOFOL 10 MG/ML IV BOLUS
INTRAVENOUS | Status: DC | PRN
  Administered 2022-02-18: 200 mg via INTRAVENOUS
  Administered 2022-02-18: 50 mg via INTRAVENOUS

## 2022-02-18 SURGICAL SUPPLY — 44 items
ADH SKN CLS APL DERMABOND .7 (GAUZE/BANDAGES/DRESSINGS) ×1
APL PRP STRL LF DISP 70% ISPRP (MISCELLANEOUS) ×1
BINDER BREAST XLRG (GAUZE/BANDAGES/DRESSINGS) ×1 IMPLANT
BULB RESERV EVAC DRAIN JP 100C (MISCELLANEOUS) ×5 IMPLANT
CHLORAPREP W/TINT 26 (MISCELLANEOUS) ×3 IMPLANT
DERMABOND ADVANCED (GAUZE/BANDAGES/DRESSINGS) ×1
DERMABOND ADVANCED .7 DNX12 (GAUZE/BANDAGES/DRESSINGS) ×2 IMPLANT
DRAIN CHANNEL JP 15F RND 16 (MISCELLANEOUS) ×5 IMPLANT
DRAPE LAPAROTOMY TRNSV 106X77 (MISCELLANEOUS) ×3 IMPLANT
DRSG GAUZE FLUFF 36X18 (GAUZE/BANDAGES/DRESSINGS) ×3 IMPLANT
ELECT REM PT RETURN 9FT ADLT (ELECTROSURGICAL) ×2
ELECTRODE REM PT RTRN 9FT ADLT (ELECTROSURGICAL) ×2 IMPLANT
GAUZE 4X4 16PLY ~~LOC~~+RFID DBL (SPONGE) ×3 IMPLANT
GAUZE SPONGE 4X4 12PLY STRL (GAUZE/BANDAGES/DRESSINGS) ×2 IMPLANT
GLOVE BIO SURGEON STRL SZ 6.5 (GLOVE) ×3 IMPLANT
GLOVE BIOGEL PI IND STRL 6.5 (GLOVE) ×2 IMPLANT
GLOVE BIOGEL PI INDICATOR 6.5 (GLOVE) ×1
GOWN STRL REUS W/ TWL LRG LVL3 (GOWN DISPOSABLE) ×4 IMPLANT
GOWN STRL REUS W/TWL LRG LVL3 (GOWN DISPOSABLE) ×2
KIT TURNOVER KIT A (KITS) ×3 IMPLANT
LABEL OR SOLS (LABEL) ×2 IMPLANT
MANIFOLD NEPTUNE II (INSTRUMENTS) ×3 IMPLANT
MARGIN MAP 10MM (MISCELLANEOUS) ×2 IMPLANT
PACK BASIN MAJOR ARMC (MISCELLANEOUS) ×2 IMPLANT
PACK BASIN MINOR ARMC (MISCELLANEOUS) ×1 IMPLANT
PAD ABD DERMACEA PRESS 5X9 (GAUZE/BANDAGES/DRESSINGS) ×3 IMPLANT
SLEVE PROBE SENORX GAMMA FIND (MISCELLANEOUS) ×3 IMPLANT
SPONGE T-LAP 18X18 ~~LOC~~+RFID (SPONGE) ×6 IMPLANT
SUT ETHILON 3-0 FS-10 30 BLK (SUTURE) ×2
SUT ETHILON 4-0 (SUTURE)
SUT ETHILON 4-0 FS2 18XMFL BLK (SUTURE)
SUT MNCRL 4-0 (SUTURE)
SUT MNCRL 4-0 27XMFL (SUTURE)
SUT SILK 2 0 SH (SUTURE) IMPLANT
SUT SILK 3-0 (SUTURE) ×2 IMPLANT
SUT VIC AB 3-0 54X BRD REEL (SUTURE) ×2 IMPLANT
SUT VIC AB 3-0 BRD 54 (SUTURE)
SUT VIC AB 3-0 SH 27 (SUTURE) ×6
SUT VIC AB 3-0 SH 27X BRD (SUTURE) ×4 IMPLANT
SUT VIC AB 3-0 SH 8-18 (SUTURE) ×3 IMPLANT
SUTURE EHLN 3-0 FS-10 30 BLK (SUTURE) ×2 IMPLANT
SUTURE ETHLN 4-0 FS2 18XMF BLK (SUTURE) IMPLANT
SUTURE MNCRL 4-0 27XMF (SUTURE) ×4 IMPLANT
WATER STERILE IRR 500ML POUR (IV SOLUTION) ×3 IMPLANT

## 2022-02-18 NOTE — H&P (Signed)
PATIENT PROFILE: Erika Figueroa is a 41 y.o. female who presents to the Clinic for consultation at the request of Dr. Lorelei Pont for evaluation of right breast cancer.  PCP: Chad Cordial, PA  HISTORY OF PRESENT ILLNESS: Erika Figueroa reports she palpated a mass on her right breast. Primary care physician ordered diagnostic mammogram and ultrasound. This confirmed multiple clumps of masses of the right breast mostly at the 3 o'clock position. 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 As per pathology, corneal tissue unable to rule out invasive carcinoma.  Family history of breast cancer: Sister with suspected DCIS Family history of other cancers: None Menarche: 71 to 41 years old LMP: Last month Used OCP: Denies Used estrogen and progesterone therapy: Denies History of Radiation to the chest: None Number of pregnancies: 2 Age of her pregnancies: 31 Previous breast biopsies: None  PROBLEM LIST: Right breast cancer  GENERAL REVIEW OF SYSTEMS:   General ROS: negative for - chills, fatigue, fever, weight gain or weight loss Allergy and Immunology ROS: negative for - hives  Hematological and Lymphatic ROS: negative for - bleeding problems or bruising, negative for palpable nodes Endocrine ROS: negative for - heat or cold intolerance, hair changes Respiratory ROS: negative for - cough, shortness of breath or wheezing Cardiovascular ROS: no chest pain or palpitations GI ROS: negative for nausea, vomiting, abdominal pain, diarrhea, constipation Musculoskeletal ROS: negative for - joint swelling or muscle pain Neurological ROS: negative for - confusion, syncope Dermatological ROS: negative for pruritus and rash Psychiatric: negative for anxiety, depression, difficulty sleeping and memory loss  MEDICATIONS: Current Outpatient Medications  Medication Sig Dispense Refill   losartan (COZAAR) 100 MG tablet Take 100 mg by mouth once  daily (Patient not taking: Reported on 01/28/2022)   No current facility-administered medications for this visit.   ALLERGIES: Patient has no known allergies.  PAST MEDICAL HISTORY: Past medical history reviewed. No significant past medical history.  PAST SURGICAL HISTORY: Past surgical history reviewed. No pertinent past surgical history  FAMILY HISTORY: Sister with DCIS  SOCIAL HISTORY: Social History   Socioeconomic History   Marital status: Married  Tobacco Use   Smoking status: Never   Smokeless tobacco: Never  Substance and Sexual Activity   Alcohol use: Never   Drug use: Never   PHYSICAL EXAM: Vitals:  01/28/22 0954  Pulse: 105   Body mass index is 30.52 kg/m. Weight: (!) 102.1 kg (225 lb)   GENERAL: Alert, active, oriented x3  HEENT: Pupils equal reactive to light. Extraocular movements are intact. Sclera clear. Palpebral conjunctiva normal red color.Pharynx clear.  NECK: Supple with no palpable mass and no adenopathy.  LUNGS: Sound clear with no rales rhonchi or wheezes.  HEART: Regular rhythm S1 and S2 without murmur.  BREAST: breasts appear normal, no suspicious masses, no skin or nipple changes or axillary nodes.  ABDOMEN: Soft and depressible, nontender with no palpable mass, no hepatomegaly.  EXTREMITIES: Well-developed well-nourished symmetrical with no dependent edema.  NEUROLOGICAL: Awake alert oriented, facial expression symmetrical, moving all extremities.  REVIEW OF DATA: I have reviewed the following data today: No results found for any previous visit.    ASSESSMENT: Erika Figueroa is a 41 y.o. female presenting for consultation for right breast DCIS.   Patient was oriented again about the pathology results. Surgical alternatives were discussed with patient including partial vs total mastectomy. Surgical technique and post operative care was discussed with patient. Risk of surgery was discussed with  patient including but not limited to:  wound infection, seroma, hematoma, brachial plexopathy, mondor's disease (thrombosis of small veins of breast), chronic wound pain, breast lymphedema, altered sensation to the nipple and cosmesis among others.   Patient with multifocal cancer of the right breast. Multiple masses at the 3:00 region. 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  Due to the multifocality of the cancer and possible more extensive disease I recommend to proceed with total mastectomy.   Malignant neoplasm of lower-inner quadrant of right female breast, unspecified estrogen receptor status (CMS-HCC) [C50.311]  PLAN: 1. Right breast mastectomy with axillary sentinel lymph node biopsy  Patient and/or representative verbalized understanding, all questions were answered, and were agreeable with the plan outlined above.   Herbert Pun, MD

## 2022-02-18 NOTE — Anesthesia Preprocedure Evaluation (Addendum)
Anesthesia Evaluation  Patient identified by MRN, date of birth, ID band Patient awake    Reviewed: Allergy & Precautions, NPO status , Patient's Chart, lab work & pertinent test results  History of Anesthesia Complications Negative for: history of anesthetic complications  Airway Mallampati: I   Neck ROM: Full    Dental no notable dental hx.    Pulmonary neg pulmonary ROS,    Pulmonary exam normal breath sounds clear to auscultation       Cardiovascular hypertension, Normal cardiovascular exam Rhythm:Regular Rate:Normal  ECG 02/13/22: normal   Neuro/Psych  Headaches, PSYCHIATRIC DISORDERS Anxiety    GI/Hepatic negative GI ROS,   Endo/Other  Obesity   Renal/GU negative Renal ROS     Musculoskeletal   Abdominal   Peds  Hematology negative hematology ROS (+)   Anesthesia Other Findings   Reproductive/Obstetrics                            Anesthesia Physical Anesthesia Plan  ASA: 2  Anesthesia Plan: General   Post-op Pain Management:    Induction: Intravenous  PONV Risk Score and Plan: 3 and Ondansetron, Dexamethasone and Treatment may vary due to age or medical condition  Airway Management Planned: LMA  Additional Equipment:   Intra-op Plan:   Post-operative Plan: Extubation in OR  Informed Consent: I have reviewed the patients History and Physical, chart, labs and discussed the procedure including the risks, benefits and alternatives for the proposed anesthesia with the patient or authorized representative who has indicated his/her understanding and acceptance.     Dental advisory given  Plan Discussed with: CRNA  Anesthesia Plan Comments: (Patient consented for risks of anesthesia including but not limited to:  - adverse reactions to medications - damage to eyes, teeth, lips or other oral mucosa - nerve damage due to positioning  - sore throat or hoarseness - damage  to heart, brain, nerves, lungs, other parts of body or loss of life  Informed patient about role of CRNA in peri- and intra-operative care.  Patient voiced understanding.)        Anesthesia Quick Evaluation

## 2022-02-18 NOTE — Transfer of Care (Signed)
Immediate Anesthesia Transfer of Care Note  Patient: VERIA STRADLEY  Procedure(s) Performed: MASTECTOMY WITH SENTINEL LYMPH NODE BIOPSY (Right: Breast)  Patient Location: PACU  Anesthesia Type:General  Level of Consciousness: awake  Airway & Oxygen Therapy: Patient Spontanous Breathing and Patient connected to face mask  Post-op Assessment: Report given to RN and Post -op Vital signs reviewed and stable  Post vital signs: Reviewed and stable  Last Vitals:  Vitals Value Taken Time  BP 140/105 02/18/22 1420  Temp 36.6 C 02/18/22 1420  Pulse 82 02/18/22 1426  Resp 11 02/18/22 1426  SpO2 99 % 02/18/22 1426    Last Pain:  Vitals:   02/18/22 1420  TempSrc:   PainSc: Asleep         Complications: No notable events documented.

## 2022-02-18 NOTE — OR PostOp (Incomplete)
PACU TO INPATIENT HANDOFF REPORT  Name/Age/Gender Erika Figueroa 41 y.o. female  Code Status Code Status History     Date Active Date Inactive Code Status Order ID Comments User Context   11/13/2017 0401 11/15/2017 0021 Full Code [id number]  Gae Dry, MD Inpatient   11/12/2017 2228 11/13/2017 0400 Full Code [id number]  Gae Dry, MD Inpatient       Home/SNF/Other {Discharge Destination:18313::"Home"}  Chief Complaint Breast cancer [facility]) [C50.919]  Level of Care/Admitting Diagnosis ED Disposition     None       Medical History Past Medical History:  Diagnosis Date   Anxiety    Cancer ([place])    Headache    Hypertension     Allergies No Known Allergies  IV Location/Drains/Wounds Patient Lines/Drains/Airways Status     Active Line/Drains/Airways     Name Placement date Placement time Site Days   Peripheral IV 02/18/22 20 G Left Hand 02/18/22  1037  Hand  less than 1   Closed System Drain 1 Right;Lateral Chest Bulb (JP) 15 Fr. 02/18/22  1333  Chest  less than 1   Incision (Closed) 11/13/17 Abdomen Other (Comment) 11/13/17  1427  -- 1558   Incision (Closed) 02/18/22 Breast Right 02/18/22  1213  -- less than 1            Labs/Imaging Results for orders placed or performed during the hospital encounter of 02/18/22 (from the past 48 hour(s))  Pregnancy, urine POC     Status: None   Collection Time: 02/18/22 10:15 AM  Result Value Ref Range   Preg Test, Ur NEGATIVE NEGATIVE    Comment:        THE SENSITIVITY OF THIS METHODOLOGY IS >24 mIU/mL    NM Sentinel Node Inj-No Rpt (Breast)  Result Date: 02/18/2022 Sulfur Colloid was injected by the Nuclear Medicine Technologist for sentinel lymph node localization.    Pending Labs   Vitals/Pain Today's Vitals   02/18/22 1508 02/18/22 1513 02/18/22 1515 02/18/22 1535  BP: (!) 155/104 (!) 143/101 (!) 143/101 (!) 152/97  Pulse: 81 76 81 85  Resp: (!) '22 14 11 '$ (!) 9  Temp:    (!) 97 F (36.1 C)   TempSrc:      SpO2: 97% 96% 96% 97%  PainSc: 5  4   0-No pain    Isolation Precautions '@ISOLATION'$ @  Administered Medications Periop Administered Meds from 02/18/2022 1010 to 02/18/2022 1542       Date/Time Order Dose Route Action Action by Comments    02/18/2022 1158 EDT acetaminophen (OFIRMEV) IV 1,000 mg Intravenous Given Loletha Grayer, CRNA --    02/18/2022 1337 EDT bupivacaine-epinephrine (PF) (MARCAINE W/ EPI) 0.5% -1:200000 30 mL, bupivacaine liposome (EXPAREL) 1.3 % 20 mL 50 mL Injection Given Herbert Pun, MD --    02/18/2022 1159 EDT ceFAZolin (ANCEF) 2-4 GM/100ML-% IVPB --  Override pull for Anesthesia Loletha Grayer, CRNA Filed by anesthesia medication administration from clinical order [id number]    02/18/2022 1139 EDT ceFAZolin (ANCEF) IVPB 2g/100 mL premix 2 g Intravenous Given Loletha Grayer, CRNA --    02/18/2022 1026 EDT chlorhexidine (PERIDEX) 0.12 % solution 15 mL 15 mL Mouth/Throat Given Tresa Endo, RN --    02/18/2022 1138 EDT dexamethasone (DECADRON) injection 10 mg Intravenous Given Loletha Grayer, CRNA --    02/18/2022 1230 EDT dexmedetomidine (PRECEDEX) in NS 20 mcg/74m (4 mcg/mL) IV Syringe 10 mcg Intravenous Given Devore, TLeana Roe CRNA --  02/18/2022 1229 EDT dexmedetomidine (PRECEDEX) in NS 20 mcg/17m (4 mcg/mL) IV Syringe 4 mcg Intravenous Given DLoletha Grayer CRNA --    02/18/2022 1220 EDT dexmedetomidine (PRECEDEX) in NS 20 mcg/540m(4 mcg/mL) IV Syringe 6 mcg Intravenous Given DeLoletha GrayerCRNA --    02/18/2022 1210 EDT dexmedetomidine (PRECEDEX) in NS 20 mcg/66m63m4 mcg/mL) IV Syringe 10 mcg Intravenous Given DevLoletha GrayerRNA --    02/18/2022 1026 EDT famotidine (PEPCID) tablet 20 mg 20 mg Oral Given RosTresa EndoN --    02/18/2022 1210 EDT fentaNYL (SUBLIMAZE) injection 25 mcg Intravenous Given DevLoletha GrayerRNA --    02/18/2022 1148 EDT fentaNYL (SUBLIMAZE) injection 25 mcg Intravenous Given DevLoletha GrayerRNA  --    02/18/2022 1136 EDT fentaNYL (SUBLIMAZE) injection 50 mcg Intravenous Given DevLoletha GrayerRNA --    02/18/2022 1513 EDT fentaNYL (SUBLIMAZE) injection 25-50 mcg 25 mcg Intravenous Given Kyre Jeffries, AmyFlonnie HailstoneN --    02/18/2022 1508 EDT fentaNYL (SUBLIMAZE) injection 25-50 mcg 25 mcg Intravenous Given Kathleene Bergemann, AmyFlonnie HailstoneN --    02/18/2022 1114 EDT heparin injection 5,000 Units 5,000 Units Subcutaneous Given RosTresa EndoN --    02/18/2022 1457 EDT hydrALAZINE (APRESOLINE) injection 5 mg 5 mg Intravenous Given Arilla Hice J, RN --    02/18/2022 1311 EDT HYDROmorphone (DILAUDID) injection 0.5 mg Intravenous Given DevLoletha GrayerRNA --    02/18/2022 1229 EDT HYDROmorphone (DILAUDID) injection 0.5 mg Intravenous Given DevLoletha GrayerRNA --    02/18/2022 1211 EDT ketamine (KETALAR) injection 20 mg Intravenous Given DevLoletha GrayerRNA --    02/18/2022 1142 EDT ketamine (KETALAR) injection 30 mg Intravenous Given DevLoletha GrayerRNA --    02/18/2022 1404 EDT lactated ringers infusion -- Intravenous Anesthesia Volume Adjustment DevLoletha GrayerRNA --    02/18/2022 1132 EDT lactated ringers infusion -- Intravenous Continued from Pre-op DevLoletha GrayerRNA --    02/18/2022 1129 EDT lactated ringers infusion -- Intravenous New Bag/Given RosTresa EndoN --    02/18/2022 1138 EDT lidocaine (cardiac) 100 mg/66mL96mYLOCAINE) injection 2% 100 mg Intravenous Given DevoLoletha GrayerNA --    02/18/2022 1026 EDT MEDLINE mouth rinse -- Mouth Rinse See Alternative RossTresa Endo --    02/18/2022 1127 EDT metoprolol tartrate (LOPRESSOR) injection 5 mg 5 mg Intravenous Given RossTresa Endo --    02/18/2022 1132 EDT midazolam (VERSED) injection 2 mg Intravenous Given DevoLoletha GrayerNA --    02/18/2022 1337 EDT ondansetron (ZOFRAN) injection 4 mg Intravenous Given DevoLoletha GrayerNA --    02/18/2022 1312 EDT phenylephrine (NEO-SYNEPHRINE) '20mg'$ /NS 250mL61mmix infusion 0 mcg/min  Intravenous Stopped DevorLoletha GrayerA --    02/18/2022 1305 EDT phenylephrine (NEO-SYNEPHRINE) '20mg'$ /NS 250mL 57mix infusion 15 mcg/min Intravenous Rate/Dose Change DevoreLoletha Grayer --    02/18/2022 1246 EDT phenylephrine (NEO-SYNEPHRINE) '20mg'$ /NS 250mL p51mx infusion 20 mcg/min Intravenous New Bag/Given Devore,Loletha Grayer--    02/18/2022 1243 EDT PHENYLephrine 80 mcg/ml in normal saline Adult IV Push Syringe (For Blood Pressure Support) 160 mcg Intravenous Given Devore,Loletha Grayer--    02/18/2022 1240 EDT PHENYLephrine 80 mcg/ml in normal saline Adult IV Push Syringe (For Blood Pressure Support) 160 mcg Intravenous Given Devore,Loletha Grayer--    02/18/2022 1140 EDT propofol (DIPRIVAN) 10 mg/mL bolus/IV push 50 mg Intravenous Given Devore, Toby RaLeana Roe--  02/18/2022 1138 EDT propofol (DIPRIVAN) 10 mg/mL bolus/IV push 200 mg Intravenous Given Loletha Grayer, CRNA --    02/18/2022 1343 EDT propofol (DIPRIVAN) 500 MG/50ML infusion 0 mcg/kg/min Intravenous Stopped Loletha Grayer, CRNA --    02/18/2022 1147 EDT propofol (DIPRIVAN) 500 MG/50ML infusion 25 mcg/kg/min Intravenous New Bag/Given Loletha Grayer, CRNA --    02/18/2022 1211 EDT sterile water for irrigation for irrigation 1,000 mL Irrigation Given Herbert Pun, MD --    02/18/2022 1102 EDT technetium TC 38M tilmanocept (LYMPHOSEEK) injection 1 millicurie 7.87 millicurie Intradermal Given McNally, Desire M, RT 4 intradermal injections in the right breast.       Mobility {Mobility:20148}

## 2022-02-18 NOTE — Anesthesia Procedure Notes (Signed)
Procedure Name: LMA Insertion Date/Time: 02/18/2022 11:40 AM Performed by: Loletha Grayer, CRNA Pre-anesthesia Checklist: Patient identified, Patient being monitored, Timeout performed, Emergency Drugs available and Suction available Patient Re-evaluated:Patient Re-evaluated prior to induction Oxygen Delivery Method: Circle system utilized Preoxygenation: Pre-oxygenation with 100% oxygen Induction Type: IV induction Ventilation: Mask ventilation without difficulty LMA: LMA inserted LMA Size: 4.5 Number of attempts: 1 Placement Confirmation: positive ETCO2 and breath sounds checked- equal and bilateral Tube secured with: Tape Dental Injury: Teeth and Oropharynx as per pre-operative assessment

## 2022-02-18 NOTE — Op Note (Signed)
Preoperative diagnosis:  Carcinoma of the right breast.  Postoperative diagnosis: Same.   Procedure: Right total mastectomy.                     Right axillary Sentinel Lymph node biopsy  Anesthesia: GETA  Surgeon: Dr. Windell Moment  Wound Classification: Clean  Indications: Patient is a 41 y.o. female who had an abnormal mammogram that on workup with core needle biopsy was found to be carcinoma of the breast. After discussion of alternatives, the patient elected total mastectomy.  Findings: No obvious palpable disease in breast or axilla  Description of procedure: The patient was brought to the operating room and general anesthesia was induced. A time-out was completed verifying correct patient, procedure, site, positioning, and implant(s) and/or special equipment prior to beginning this procedure. The breast, chest wall, axilla, and upper arm and neck were prepped and draped in the usual sterile fashion.  A skin incision was made that encompassed the nipple-areola complex and the previous biopsy scar and passed in an oblique direction across the breast. Flaps were raised in the avascular plane between subcutaneous tissue and breast tissue from the clavicle superiorly, the sternum medially, the anterior rectus sheath inferiorly, and past the lateral border of the pectoralis major muscle laterally. Hemostasis was achieved in the flaps. Next, the breast tissue and underlying pectoralis fascia were excised from the pectoralis major muscle, progressing from medially to laterally. At the lateral border of the pectoralis major muscle, the breast tissue was swung laterally and a lateral pedicle identified where breast tissue gave way to fat of axilla. The lateral pedicle was incised and the specimen removed.   A hand-held gamma probe was used to identify the location of the hottest spot in the axilla. Dissection was carried down until subdermal facias was advanced. The probe was placed and again, the  point of maximal count was found. Dissection continue until nodule was identified. The probe was placed in contact with the node. The node was excised in its entirety.  No additional hot spots were identified. No clinically abnormal nodes were palpated. The procedure was terminated. Hemostasis was achieved.   The wound was irrigated and hemostasis was achieved. Closed suction drains were brought into the operative field through a separate stab incision and sutured to the skin with a 3-0 nylon suture. The wound was closed with interrupted 3-0 Vicryl to the subcutaneous layer, followed by a subcuticular layer of Monocryl 4-0. The wound was dressed.   The patient tolerated the procedure well and was taken to the postanesthesia care unit in stable condition.     Sentinel Node Biopsy Synoptic Operative Report  Operation performed with curative intent:Yes  Tracer(s) used to identify sentinel nodes in the upfront surgery (non-neoadjuvant) setting (select all that apply):Radioactive Tracer  Tracer(s) used to identify sentinel nodes in the neoadjuvant setting (select all that apply):N/A  All nodes (colored or non-colored) present at the end of a dye-filled lymphatic channel were removed:N/A  All significantly radioactive nodes were removed:Yes  All palpable suspicious nodes were removed:N/A  Biopsy-proven positive nodes marked with clips prior to chemotherapy were identified and removed:N/A  Specimen: Right Breast                    Sentinel lymph node.   Complications: None  Estimated Blood Loss: 50 mL

## 2022-02-19 ENCOUNTER — Encounter: Payer: Self-pay | Admitting: Licensed Clinical Social Worker

## 2022-02-19 ENCOUNTER — Ambulatory Visit: Payer: Self-pay | Admitting: Licensed Clinical Social Worker

## 2022-02-19 ENCOUNTER — Telehealth: Payer: Self-pay | Admitting: Licensed Clinical Social Worker

## 2022-02-19 DIAGNOSIS — Z1379 Encounter for other screening for genetic and chromosomal anomalies: Secondary | ICD-10-CM | POA: Insufficient documentation

## 2022-02-19 DIAGNOSIS — Z79899 Other long term (current) drug therapy: Secondary | ICD-10-CM | POA: Diagnosis not present

## 2022-02-19 DIAGNOSIS — Z6829 Body mass index (BMI) 29.0-29.9, adult: Secondary | ICD-10-CM | POA: Diagnosis not present

## 2022-02-19 DIAGNOSIS — I10 Essential (primary) hypertension: Secondary | ICD-10-CM | POA: Diagnosis not present

## 2022-02-19 DIAGNOSIS — C50919 Malignant neoplasm of unspecified site of unspecified female breast: Secondary | ICD-10-CM | POA: Diagnosis not present

## 2022-02-19 DIAGNOSIS — E669 Obesity, unspecified: Secondary | ICD-10-CM | POA: Diagnosis not present

## 2022-02-19 DIAGNOSIS — D0511 Intraductal carcinoma in situ of right breast: Secondary | ICD-10-CM | POA: Diagnosis not present

## 2022-02-19 DIAGNOSIS — F419 Anxiety disorder, unspecified: Secondary | ICD-10-CM | POA: Diagnosis not present

## 2022-02-19 LAB — CBC
HCT: 38.3 % (ref 36.0–46.0)
Hemoglobin: 12.9 g/dL (ref 12.0–15.0)
MCH: 30.1 pg (ref 26.0–34.0)
MCHC: 33.7 g/dL (ref 30.0–36.0)
MCV: 89.5 fL (ref 80.0–100.0)
Platelets: 339 10*3/uL (ref 150–400)
RBC: 4.28 MIL/uL (ref 3.87–5.11)
RDW: 13.4 % (ref 11.5–15.5)
WBC: 10.7 10*3/uL — ABNORMAL HIGH (ref 4.0–10.5)
nRBC: 0 % (ref 0.0–0.2)

## 2022-02-19 LAB — BASIC METABOLIC PANEL
Anion gap: 6 (ref 5–15)
BUN: 12 mg/dL (ref 6–20)
CO2: 25 mmol/L (ref 22–32)
Calcium: 9 mg/dL (ref 8.9–10.3)
Chloride: 108 mmol/L (ref 98–111)
Creatinine, Ser: 0.87 mg/dL (ref 0.44–1.00)
GFR, Estimated: >60 mL/min (ref >60)
Glucose, Bld: 109 mg/dL — ABNORMAL HIGH (ref 70–99)
Potassium: 4.1 mmol/L (ref 3.5–5.1)
Sodium: 139 mmol/L (ref 135–145)

## 2022-02-19 MED ORDER — HYDROCODONE-ACETAMINOPHEN 5-325 MG PO TABS: 1.0000 | ORAL_TABLET | ORAL | 0 refills | Status: DC | PRN

## 2022-02-19 MED ORDER — GABAPENTIN 300 MG PO CAPS: 300.0000 mg | ORAL_CAPSULE | Freq: Three times a day (TID) | ORAL | 0 refills | Status: DC

## 2022-02-19 NOTE — Progress Notes (Signed)
Discharge instructions reviewed with patient. No further questions or concerns at this time. PIV removed with no complications. Patient discharging home via car with her husband. Patient safe and stable to DC

## 2022-02-19 NOTE — Progress Notes (Signed)
Met patient post -op mastectomy prior to discharge.  Assist with Camisole for drains through [employer]!

## 2022-02-19 NOTE — Anesthesia Postprocedure Evaluation (Signed)
Anesthesia Post Note  Patient: Erika Figueroa  Procedure(s) Performed: MASTECTOMY WITH SENTINEL LYMPH NODE BIOPSY (Right: Breast)  Patient location during evaluation: PACU Anesthesia Type: General Level of consciousness: awake and alert, oriented and patient cooperative Pain management: pain level controlled Vital Signs Assessment: post-procedure vital signs reviewed and stable Respiratory status: spontaneous breathing, nonlabored ventilation and respiratory function stable Cardiovascular status: blood pressure returned to baseline and stable Postop Assessment: adequate PO intake Anesthetic complications: no   No notable events documented.   Last Vitals:  Vitals:   02/18/22 1938 02/19/22 0535  BP: (!) 154/92 136/84  Pulse: 87 86  Resp: 18 16  Temp: (!) 36.4 C 36.4 C  SpO2: 99% 98%    Last Pain:  Vitals:   02/18/22 2017  TempSrc:   PainSc: 2                  Darrin Nipper

## 2022-02-19 NOTE — Discharge Instructions (Signed)

## 2022-02-19 NOTE — Discharge Summary (Signed)
  Patient ID: RALYNN SAN MRN: [id number] DOB/AGE: [birth year] 41 y.o.  Admit date: 02/18/2022 Discharge date: 02/19/2022   Discharge Diagnoses:  Principal Problem:   Breast cancer [facility])   Procedures: Right total mastectomy with right axillary sentinel lymph node biopsy  Hospital Course: Patient admitted for surgical management of right breast cancer.  She underwent right total mastectomy with right axillary sentinel lymph node biopsy.  Patient has been doing well.  This morning pain controlled with current pain medication.  The wound is dry and clean.  No ischemic tissue.  Healthy flaps.  No fluid collection.  Drains with adequate output.  Physical Exam Vitals reviewed.  HENT:     Head: Normocephalic.  Cardiovascular:     Rate and Rhythm: Normal rate and regular rhythm.  Pulmonary:     Effort: Pulmonary effort is normal.     Breath sounds: Normal breath sounds.  Chest:  Breasts:    Right: Absent.  Abdominal:     General: Abdomen is flat.  Musculoskeletal:     Cervical back: Normal range of motion.  Skin:    General: Skin is warm.     Capillary Refill: Capillary refill takes less than 2 seconds.  Neurological:     Mental Status: She is alert and oriented to person, place, and time.     Consults: None  Disposition: Discharge disposition: 01-Home or Self Care       Discharge Instructions     Diet - low sodium heart healthy   Complete by: As directed    Increase activity slowly   Complete by: As directed       Allergies as of 02/19/2022   No Known Allergies      Medication List     TAKE these medications    diazepam 2 MG tablet Commonly known as: Valium Take 1 tablet (2 mg total) by mouth every 12 (twelve) hours as needed for muscle spasms.   gabapentin 300 MG capsule Commonly known as: NEURONTIN Take 1 capsule (300 mg total) by mouth 3 (three) times daily for 10 days.   HYDROcodone-acetaminophen 5-325 MG tablet Commonly known as:  NORCO/VICODIN Take 1-2 tablets by mouth every 4 (four) hours as needed for moderate pain.   ondansetron 4 MG disintegrating tablet Commonly known as: ZOFRAN-ODT Take 1 tablet (4 mg total) by mouth every 8 (eight) hours as needed for nausea or vomiting.        Follow-up Information     Herbert Pun, MD Follow up in 1 week(s).   Specialty: General Surgery Contact information: [address] [place] [address] [phone]

## 2022-02-19 NOTE — TOC Initial Note (Signed)
Transition of Care [facility]) - Initial/Assessment Note    Patient Details  Name: Erika Figueroa MRN: [id number] Date of Birth: 1981-03-20  Transition of Care [facility]) CM/SW Contact:    Laurena Slimmer, RN Phone Number: 02/19/2022, 9:47 AM  Clinical Narrative:                  Transition of Care [facility]) Screening Note   Patient Details  Name: Erika Figueroa Date of Birth: 1980/10/17   Transition of Care [facility]) CM/SW Contact:    Laurena Slimmer, RN Phone Number: 02/19/2022, 9:48 AM    Transition of Care Department [facility]) has reviewed patient and no TOC needs have been identified at this time. We will continue to monitor patient advancement through interdisciplinary progression rounds. If new patient transition needs arise, please place a TOC consult.          Patient Goals and CMS Choice        Expected Discharge Plan and Services           Expected Discharge Date: 02/19/22                                    Prior Living Arrangements/Services                       Activities of Daily Living Home Assistive Devices/Equipment: Eyeglasses ADL Screening (condition at time of admission) Patient's cognitive ability adequate to safely complete daily activities?: Yes Is the patient deaf or have difficulty hearing?: No Does the patient have difficulty seeing, even when wearing glasses/contacts?: No Does the patient have difficulty concentrating, remembering, or making decisions?: No Patient able to express need for assistance with ADLs?: Yes Does the patient have difficulty dressing or bathing?: No Independently performs ADLs?: Yes (appropriate for developmental age) Does the patient have difficulty walking or climbing stairs?: No Weakness of Legs: None Weakness of Arms/Hands: None  Permission Sought/Granted                  Emotional Assessment              Admission diagnosis:  Breast cancer [facility]) [C50.919] Patient Active Problem List    Diagnosis Date Noted   Breast cancer ([place]) 02/03/2022   Mass of upper inner quadrant of right breast 12/11/2021   Encounter for sterilization 11/13/2017   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 [place] [address] Phone: [phone] Fax: [phone]     Social Determinants of Health (SDOH) Interventions    Readmission Risk Interventions     View : No data to display.

## 2022-02-19 NOTE — Progress Notes (Signed)
HPI:  [name] was previously seen in the [place] clinic due to a personal and family history of cancer and concerns regarding a hereditary predisposition to cancer. Please refer to our prior cancer genetics clinic note for more information regarding our discussion, assessment and recommendations, at the time. [name] recent genetic test results were disclosed to her, as were recommendations warranted by these results. These results and recommendations are discussed in more detail below.  CANCER HISTORY:  Oncology History   No history exists.    FAMILY HISTORY:  We obtained a detailed, 4-generation family history.  Significant diagnoses are listed below: Family History  Problem Relation Age of Onset   Diabetes Mother    ALS Mother    Hypertension Father    Alzheimer's disease Father    Hyperlipidemia Father    Dementia Father    Hypothyroidism Sister    Breast cancer Sister 15       no mass, but did hormone tx   Hyperlipidemia Brother    Hypertension Brother    Diabetes Maternal Aunt    Breast cancer Cousin        second paternal cousin   [name] has 1 son, 65 and 1 daughter, 8. She has 1 sister and 1 brother. Her sister had "breast cancer cells" at 73 which she had lumpectomy for, no radiation, and is taking antiestrogen therapy. She also had negative genetic testing reportedly.    [name] mother died at 94. Patient is unaware of cancer diagnoses on this side of the family.   [name] father died at 27 of Alzheimer's. No known cancers on this side of the family either.    [name] is unaware of previous family history of genetic testing for hereditary cancer risks. There is no reported Ashkenazi Jewish ancestry. There is no known consanguinity.     GENETIC TEST RESULTS: Genetic testing reported out on 02/17/2022 through the [facility]CustomNext-Cancer+RNA cancer panel found no pathogenic mutations.   The  CustomNext-Cancer+RNAinsight panel offered by [facility] includes sequencing and rearrangement analysis for the following 47 genes:  APC, ATM, AXIN2, BARD1, BMPR1A, BRCA1, BRCA2, BRIP1, CDH1, CDK4, CDKN2A, CHEK2, DICER1, EPCAM, GREM1, HOXB13, MEN1, MLH1, MSH2, MSH3, MSH6, MUTYH, NBN, NF1, NF2, NTHL1, PALB2, PMS2, POLD1, POLE, PTEN, RAD51C, RAD51D, RECQL, RET, SDHA, SDHAF2, SDHB, SDHC, SDHD, SMAD4, SMARCA4, STK11, TP53, TSC1, TSC2, and VHL.  RNA data is routinely analyzed for use in variant interpretation for all genes.   The test report has been scanned into EPIC and is located under the Molecular Pathology section of the Results Review tab.  A portion of the result report is included below for reference.    We discussed that because current genetic testing is not perfect, it is possible there may be a gene mutation in one of these genes that current testing cannot detect, but that chance is small.  There could be another gene that has not yet been discovered, or that we have not yet tested, that is responsible for the cancer diagnoses in the family. It is also possible there is a hereditary cause for the cancer in the family that [name] did not inherit and therefore was not identified in her testing.  Therefore, it is important to remain in touch with cancer genetics in the future so that we can continue to offer [name] the most up to date genetic testing.   ADDITIONAL GENETIC TESTING: We discussed with [name] that her genetic testing  was fairly extensive.  If there are genes identified to increase cancer risk that can be analyzed in the future, we would be happy to discuss and coordinate this testing at that time.    CANCER SCREENING RECOMMENDATIONS: [name] test result is considered negative (normal).  This means that we have not identified a hereditary cause for her  personal and family history of cancer at this time. Most cancers happen by chance and this negative test suggests  that her cancer may fall into this category.    While reassuring, this does not definitively rule out a hereditary predisposition to cancer. It is still possible that there could be genetic mutations that are undetectable by current technology. There could be genetic mutations in genes that have not been tested or identified to increase cancer risk.  Therefore, it is recommended she continue to follow the cancer management and screening guidelines provided by her oncology and primary healthcare provider.   An individual's cancer risk and medical management are not determined by genetic test results alone. Overall cancer risk assessment incorporates additional factors, including personal medical history, family history, and any available genetic information that may result in a personalized plan for cancer prevention and surveillance.  RECOMMENDATIONS FOR FAMILY MEMBERS:  Relatives in this family might be at some increased risk of developing cancer, over the general population risk, simply due to the family history of cancer.  We recommended female relatives in this family have a yearly mammogram beginning at age 61, or 7 years younger than the earliest onset of cancer, an annual clinical breast exam, and perform monthly breast self-exams. Female relatives in this family should also have a gynecological exam as recommended by their primary provider.  All family members should be referred for colonoscopy starting at age 55.   FOLLOW-UP: Lastly, we discussed with [name] that cancer genetics is a rapidly advancing field and it is possible that new genetic tests will be appropriate for her and/or her family members in the future. We encouraged her to remain in contact with cancer genetics on an annual basis so we can update her personal and family histories and let her know of advances in cancer genetics that may benefit this family.   Our contact number was provided. [name] questions were answered  to her satisfaction, and she knows she is welcome to call us at anytime with additional questions or concerns.   Faith Rogue, MS, [facility] Genetic Counselor [email or web address] Phone: [phone]

## 2022-02-19 NOTE — Telephone Encounter (Signed)
Revealed negative genetic testing on full panel.  This normal result is reassuring and indicates that it is unlikely Erika Figueroa cancer is due to a hereditary cause.  It is unlikely that there is an increased risk of another cancer due to a mutation in one of these genes.  However, genetic testing is not perfect, and cannot definitively rule out a hereditary cause.  It will be important for her to keep in contact with genetics to learn if any additional testing may be needed in the future.

## 2022-02-25 ENCOUNTER — Inpatient Hospital Stay: Payer: BC Managed Care – PPO | Attending: Oncology | Admitting: Hospice and Palliative Medicine

## 2022-02-25 ENCOUNTER — Other Ambulatory Visit: Payer: Self-pay | Admitting: Anatomic Pathology & Clinical Pathology

## 2022-02-25 DIAGNOSIS — Z803 Family history of malignant neoplasm of breast: Secondary | ICD-10-CM | POA: Insufficient documentation

## 2022-02-25 DIAGNOSIS — C50911 Malignant neoplasm of unspecified site of right female breast: Secondary | ICD-10-CM

## 2022-02-25 DIAGNOSIS — D0511 Intraductal carcinoma in situ of right breast: Secondary | ICD-10-CM | POA: Insufficient documentation

## 2022-02-25 DIAGNOSIS — Z9011 Acquired absence of right breast and nipple: Secondary | ICD-10-CM | POA: Insufficient documentation

## 2022-02-25 LAB — SURGICAL PATHOLOGY

## 2022-02-25 NOTE — Progress Notes (Signed)
Multidisciplinary Oncology Council Documentation  NORITA MEIGS was presented by our [facility] on 02/25/2022, which included representatives from:  Palliative Care Dietitian  Physical/Occupational Therapist Nurse Navigator Genetics Speech Therapist Social work Survivorship RN Financial Navigator Research RN   Camellia currently presents with history of breast cancer  We reviewed previous medical and familial history, history of present illness, and recent lab results along with all available histopathologic and imaging studies. The [place] considered available treatment options and made the following recommendations/referrals:  Genetics, SW  The [facility] is a meeting of clinicians from various specialty areas who evaluate and discuss patients for whom a multidisciplinary approach is being considered. Final determinations in the plan of care are those of the provider(s).   Today's extended care, comprehensive team conference, Shaunee was not present for the discussion and was not examined.

## 2022-02-27 ENCOUNTER — Encounter: Payer: BC Managed Care – PPO | Admitting: Plastic Surgery

## 2022-03-02 ENCOUNTER — Encounter: Payer: Self-pay | Admitting: Licensed Clinical Social Worker

## 2022-03-02 NOTE — Progress Notes (Signed)
[facility] Clinical Social Work  Clinical Social Work was referred by medical provider for assessment of psychosocial needs.  Clinical Social Worker contacted patient by phone  to offer support and assess for needs.  CSW left voicemail with contact information and request for a return call.    First Attempt   Marialena Wollen, LCSW  Clinical Social Worker [facility]          

## 2022-03-02 NOTE — Progress Notes (Signed)
Erika Figueroa  Initial Assessment   Erika Figueroa is a 41 y.o. year old female contacted by phone. Clinical Social [place] was referred by medical provider for assessment of psychosocial needs.   SDOH (Social Determinants of Health) assessments performed: Yes SDOH Interventions    Flowsheet Row Most Recent Value  SDOH Interventions   Food Insecurity Interventions Intervention Not Indicated  Financial Strain Interventions Other (Comment)  [fund manager referral]  Housing Interventions Intervention Not Indicated  Stress Interventions Intervention Not Indicated  Social Connections Interventions Intervention Not Indicated  Transportation Interventions Intervention Not Indicated       SDOH Screenings   Alcohol Screen: Low Risk  (03/02/2022)   Alcohol Screen    Last Alcohol Screening Score (AUDIT): 0  Depression (PHQ2-9): Low Risk  (03/02/2022)   Depression (PHQ2-9)    PHQ-2 Score: 0  Financial Resource Strain: Low Risk  (03/02/2022)   Overall Financial Resource Strain (CARDIA)    Difficulty of Paying Living Expenses: Not very hard  Food Insecurity: No Food Insecurity (03/02/2022)   Hunger Vital Sign    Worried About Running Out of Food in the Last Year: Never true    Ran Out of Food in the Last Year: Never true  Housing: Low Risk  (03/02/2022)   Housing    Last Housing Risk Score: 0  Physical Activity: Unknown (03/02/2022)   Exercise Vital Sign    Days of Exercise per Week: Patient refused    Minutes of Exercise per Session: Patient refused  Social Connections: Moderately Integrated (03/02/2022)   Social Connection and Isolation Panel [NHANES]    Frequency of Communication with Friends and Family: Three times a week    Frequency of Social Gatherings with Friends and Family: Three times a week    Attends Religious Services: 1 to 4 times per year    Active Member of Clubs or Organizations: No    Attends Archivist Meetings: Never    Marital Status: Married   Stress: Stress Concern Present (03/02/2022)   [employer] [place]    Feeling of Stress : To some extent  Tobacco Use: Low Risk  (02/18/2022)   Patient History    Smoking Tobacco Use: Never    Smokeless Tobacco Use: Never    Passive Exposure: Not on file  Transportation Needs: No Transportation Needs (03/02/2022)   PRAPARE - Transportation    Lack of Transportation (Medical): No    Lack of Transportation (Non-Medical): No     Distress Screen completed: No     No data to display            Family/Social Information:  Housing Arrangement: patient lives with spouse  Erika, Figueroa [id number] and children Family members/support persons in your life? Family, Friends, Medical laboratory scientific officer, and Geophysical data processor concerns: no  Employment: Out on [place] excuse , full-time currently on [employer].  Income source: Employment and Supported by [employer] and Friends Financial concerns:  No current concerns, but is interested in referral to Designer, jewellery and financial assistance programs from outside organizations. Type of concern: Medical bills Food access concerns: no Religious or spiritual practice: Yes-. Services Currently in place:  BCBS PPO  Coping/ Adjustment to diagnosis: Patient understands treatment plan and what happens next? yes Concerns about diagnosis and/or treatment: Losing my job and/or losing income Patient reported stressors: [place]/ school, Anxiety/ nervousness, and Adjusting to my illness Hopes and/or priorities: To return to [place] Patient enjoys time with family/ friends Current  coping skills/ strengths: Capable of independent living , Communication skills , Scientist, research (life sciences) , Motivation for treatment/growth , Supportive family/friends , and [place] skills     SUMMARY: Current SDOH Barriers:  No current SDOH concerns, but would like information on financial assistance   Clinical Social Figu[place] Clinical Goal(s):  No  clinical social Figu[place]a goals at this time  Interventions: Discussed common feeling and emotions when being diagnosed with cancer, and the importance of support during treatment Informed patient of the support team roles and support services at [facility] Provided [place] contact information and encouraged patient to call with any questions or concerns Referred patient to Designer, jewellery and Provided patient with information about CSW role in patient care and available resources.  CSW emailed patient information on financial resources from outside organizations. Email [email or web address]   Follow Up Plan: Patient will contact CSW with any support or resource needs Patient verbalizes understanding of plan: Yes    Erika Gaines, LCSW

## 2022-03-06 ENCOUNTER — Encounter: Payer: Self-pay | Admitting: Oncology

## 2022-03-06 ENCOUNTER — Inpatient Hospital Stay (HOSPITAL_BASED_OUTPATIENT_CLINIC_OR_DEPARTMENT_OTHER): Payer: BC Managed Care – PPO | Admitting: Oncology

## 2022-03-06 ENCOUNTER — Inpatient Hospital Stay: Payer: BC Managed Care – PPO | Admitting: Oncology

## 2022-03-06 VITALS — BP 142/101 | HR 103 | Temp 98.8°F | Resp 20 | Wt 223.4 lb

## 2022-03-06 DIAGNOSIS — D0511 Intraductal carcinoma in situ of right breast: Secondary | ICD-10-CM

## 2022-03-06 DIAGNOSIS — Z803 Family history of malignant neoplasm of breast: Secondary | ICD-10-CM | POA: Diagnosis not present

## 2022-03-06 DIAGNOSIS — Z9011 Acquired absence of right breast and nipple: Secondary | ICD-10-CM | POA: Diagnosis not present

## 2022-03-06 DIAGNOSIS — C50911 Malignant neoplasm of unspecified site of right female breast: Secondary | ICD-10-CM

## 2022-03-06 MED ORDER — TAMOXIFEN CITRATE 20 MG PO TABS: 20.0000 mg | ORAL_TABLET | Freq: Every day | ORAL | 3 refills | Status: DC

## 2022-03-08 DIAGNOSIS — D0510 Intraductal carcinoma in situ of unspecified breast: Secondary | ICD-10-CM | POA: Insufficient documentation

## 2022-03-08 NOTE — Progress Notes (Signed)
Hematology/Oncology Consult note [facility]  [phone] [phone]  Patient Care Team: Copland, Ginette Otto as PCP - General (Obstetrics and Gynecology) Theodore Demark, RN (Inactive) as Oncology Nurse Navigator Daiva Huge, RN as Oncology Nurse Navigator   Name of the patient: Erika Figueroa  [id number]  10/15/1980   Date of visit: 03/08/22  Diagnosis-right breast DCIS ER positive  Chief complaint/ Reason for visit-discuss pathology results and further management  Heme/Onc history: Patient is a 41 year old female who underwent a diagnostic bilateral mammogram in April 2023 after she self palpated a right breast lump.  Mammogram and ultrasound showed numerous suspicious masses spanning the 3:00 axis in the right breast anterior to posterior depth 6 to 7 cm mammographically.  2 of the largest masses measured 2.3 x 1 x 1 cm and the other 1 measuring 1.4 x 1.3 x 0.8 cm.  No suspicious right axillary adenopathy.  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  Has seen Dr. Peyton Najjar and plan is to proceed with lumpectomy and sentinel lymph node biopsy   Menarche at 37, she is currently premenopausal.  She is G2 P2 and currently does not use any birth control.  No abnormal breast biopsies or mammograms in the past.  Family history significant for her sister with possible DCIS but was tested for BRCA and was negative  Patient underwent a right simple mastectomy on 02/18/2022 which showed a 7.3 cm grade 2 DCIS with unifocal positive anterior margin.  1 lymph node negative for malignancy.  No invasive cancer identified.  Interval history-patient is here with her husband today.  She is recovering well from her mastectomy.  Breast drains are out.  There is concern for possible seroma at the site of mastectomy  ECOG PS- 0 Pain scale-  0   Review of systems- Review of Systems  Constitutional:  Negative for chills, fever, malaise/fatigue and weight loss.  HENT:  Negative for congestion, ear discharge and nosebleeds.   Eyes:  Negative for blurred vision.  Respiratory:  Negative for cough, hemoptysis, sputum production, shortness of breath and wheezing.   Cardiovascular:  Negative for chest pain, palpitations, orthopnea and claudication.  Gastrointestinal:  Negative for abdominal pain, blood in stool, constipation, diarrhea, heartburn, melena, nausea and vomiting.  Genitourinary:  Negative for dysuria, flank pain, frequency, hematuria and urgency.  Musculoskeletal:  Negative for back pain, joint pain and myalgias.  Skin:  Negative for rash.  Neurological:  Negative for dizziness, tingling, focal weakness, seizures, weakness and headaches.  Endo/Heme/Allergies:  Does not bruise/bleed easily.  Psychiatric/Behavioral:  Negative for depression and suicidal ideas. The patient does not have insomnia.       No Known Allergies   Past Medical History:  Diagnosis Date   Anxiety    Cancer ([place])    Headache    Hypertension      Past Surgical History:  Procedure Laterality Date   BREAST SURGERY     biopsy right   MASTECTOMY W/ SENTINEL NODE BIOPSY Right 02/18/2022   Procedure: MASTECTOMY WITH SENTINEL LYMPH NODE BIOPSY;  Surgeon: Herbert Pun, MD;  Location: [facility] ORS;  Service: General;  Laterality: Right;   TUBAL LIGATION Bilateral 11/13/2017   Procedure: POST PARTUM TUBAL LIGATION;  Surgeon: Will Bonnet, MD;  Location: [facility] ORS;  Service: Gynecology;  Laterality: Bilateral;    Social History  Socioeconomic History   Marital status: Married    Spouse name: Reggie   Number of children: 2   Years of education: Not on file   Highest education level: Not on file  Occupational History   Occupation: Administrator  Tobacco Use   Smoking status: Never   Smokeless tobacco: Never  Vaping Use    Vaping Use: Never used  Substance and Sexual Activity   Alcohol use: No    Comment: prior to positive ICON   Drug use: No   Sexual activity: Yes    Partners: Male    Birth control/protection: Surgical    Comment: Tubal Ligation  Other Topics Concern   Not on file  Social History Narrative   Not on file   Social Determinants of Health   Financial Resource Strain: Low Risk  (03/02/2022)   Overall Financial Resource Strain (CARDIA)    Difficulty of Paying Living Expenses: Not very hard  Food Insecurity: No Food Insecurity (03/02/2022)   Hunger Vital Sign    Worried About Running Out of Food in the Last Year: Never true    Ran Out of Food in the Last Year: Never true  Transportation Needs: No Transportation Needs (03/02/2022)   PRAPARE - Hydrologist (Medical): No    Lack of Transportation (Non-Medical): No  Physical Activity: Unknown (03/02/2022)   Exercise Vital Sign    Days of Exercise per Week: Patient refused    Minutes of Exercise per Session: Patient refused  Stress: Stress Concern Present (03/02/2022)   [place]    Feeling of Stress : To some extent  Social Connections: Moderately Integrated (03/02/2022)   Social Connection and Isolation Panel [NHANES]    Frequency of Communication with Friends and Family: Three times a week    Frequency of Social Gatherings with Friends and Family: Three times a week    Attends Religious Services: 1 to 4 times per year    Active Member of Clubs or Organizations: No    Attends Archivist Meetings: Never    Marital Status: Married  Human resources officer Violence: Not At Risk (03/02/2022)   Humiliation, Afraid, Rape, and Kick questionnaire    Fear of Current or Ex-Partner: No    Emotionally Abused: No    Physically Abused: No    Sexually Abused: No    Family History  Problem Relation Age of Onset   Diabetes Mother    ALS Mother     Hypertension Father    Alzheimer's disease Father    Hyperlipidemia Father    Dementia Father    Hypothyroidism Sister    Breast cancer Sister 18       no mass, but did hormone tx   Hyperlipidemia Brother    Hypertension Brother    Diabetes Maternal Aunt    Breast cancer Cousin        second paternal cousin     Current Outpatient Medications:    diazepam (VALIUM) 2 MG tablet, Take 1 tablet (2 mg total) by mouth every 12 (twelve) hours as needed for muscle spasms., Disp: 20 tablet, Rfl: 0   gabapentin (NEURONTIN) 300 MG capsule, Take 1 capsule (300 mg total) by mouth 3 (three) times daily for 10 days., Disp: 30 capsule, Rfl: 0   HYDROcodone-acetaminophen (NORCO/VICODIN) 5-325 MG tablet, Take 1-2 tablets by mouth every 4 (four) hours as needed for moderate pain., Disp: 30 tablet, Rfl: 0   ondansetron (  ZOFRAN-ODT) 4 MG disintegrating tablet, Take 1 tablet (4 mg total) by mouth every 8 (eight) hours as needed for nausea or vomiting., Disp: 20 tablet, Rfl: 0   tamoxifen (NOLVADEX) 20 MG tablet, Take 1 tablet (20 mg total) by mouth daily., Disp: 30 tablet, Rfl: 3  Physical exam:  Vitals:   03/06/22 1403  BP: (!) 142/101  Pulse: (!) 103  Resp: 20  Temp: 98.8 F (37.1 C)  SpO2: 99%  Weight: 223 lb 6.4 oz (101.3 kg)   Physical Exam Constitutional:      General: She is not in acute distress. Cardiovascular:     Rate and Rhythm: Normal rate and regular rhythm.     Heart sounds: Normal heart sounds.  Pulmonary:     Effort: Pulmonary effort is normal.     Breath sounds: Normal breath sounds.  Abdominal:     General: Bowel sounds are normal.     Palpations: Abdomen is soft.  Skin:    General: Skin is warm and dry.  Neurological:     Mental Status: She is alert and oriented to person, place, and time.         Latest Ref Rng & Units 02/19/2022    5:46 AM  CMP  Glucose 70 - 99 mg/dL 109   BUN 6 - 20 mg/dL 12   Creatinine 0.44 - 1.00 mg/dL 0.87   Sodium 135 - 145 mmol/L 139    Potassium 3.5 - 5.1 mmol/L 4.1   Chloride 98 - 111 mmol/L 108   CO2 22 - 32 mmol/L 25   Calcium 8.9 - 10.3 mg/dL 9.0       Latest Ref Rng & Units 02/19/2022    5:46 AM  CBC  WBC 4.0 - 10.5 K/uL 10.7   Hemoglobin 12.0 - 15.0 g/dL 12.9   Hematocrit 36.0 - 46.0 % 38.3   Platelets 150 - 400 K/uL 339     No images are attached to the encounter.  NM Sentinel Node Inj-No Rpt (Breast)  Result Date: 02/18/2022 Sulfur Colloid was injected by the Nuclear Medicine Technologist for sentinel lymph node localization.     Assessment and plan- Patient is a 41 y.o. female with right breast DCIS ER positive here to discuss further management post right mastectomy  Discussed with patient that final pathology showed a 7.3 cm grade 2 DCIS.  Ideally a 2 mm clear margin is recommended in DCIS and patient had a unifocal positive anterior margin.  However given the fact that she had a mastectomy, I discussed her case with both Dr. Baruch Gouty as well as Dr. Peyton Najjar.  Reexcision surgery is not currently recommended and she would not benefit from postmastectomy radiation either.  Given that she has ER positive DCIS I would recommend endocrine therapy for her.  She is premenopausal and therefore I would recommend tamoxifen for 5 years.  Discussed risks and benefits of tamoxifen including all but not limited to hot flashes, fatigue, increased risk of cataracts DVT and uterine cancer.  Treatment will be given with a curative intent.  Patient understands and agrees to proceed as planned.  I will see her back in 3 months with a CMP   Visit Diagnosis 1. Ductal carcinoma in situ (DCIS) of right breast      Dr. Randa Evens, MD, MPH [facility] at [facility] [phone] 03/08/2022 7:06 PM

## 2022-03-11 ENCOUNTER — Encounter: Payer: Self-pay | Admitting: Obstetrics and Gynecology

## 2022-03-17 ENCOUNTER — Ambulatory Visit: Payer: BC Managed Care – PPO | Attending: General Surgery | Admitting: Occupational Therapy

## 2022-03-17 ENCOUNTER — Encounter: Payer: Self-pay | Admitting: Occupational Therapy

## 2022-03-17 DIAGNOSIS — M25611 Stiffness of right shoulder, not elsewhere classified: Secondary | ICD-10-CM | POA: Insufficient documentation

## 2022-03-17 DIAGNOSIS — N6489 Other specified disorders of breast: Secondary | ICD-10-CM | POA: Insufficient documentation

## 2022-03-17 DIAGNOSIS — M79601 Pain in right arm: Secondary | ICD-10-CM | POA: Insufficient documentation

## 2022-03-17 DIAGNOSIS — L905 Scar conditions and fibrosis of skin: Secondary | ICD-10-CM | POA: Insufficient documentation

## 2022-03-17 DIAGNOSIS — Z9011 Acquired absence of right breast and nipple: Secondary | ICD-10-CM | POA: Insufficient documentation

## 2022-03-19 ENCOUNTER — Encounter: Payer: Self-pay | Admitting: *Deleted

## 2022-03-30 ENCOUNTER — Ambulatory Visit: Payer: BC Managed Care – PPO | Attending: General Surgery | Admitting: Occupational Therapy

## 2022-03-30 DIAGNOSIS — Z9011 Acquired absence of right breast and nipple: Secondary | ICD-10-CM | POA: Insufficient documentation

## 2022-03-30 DIAGNOSIS — L905 Scar conditions and fibrosis of skin: Secondary | ICD-10-CM | POA: Insufficient documentation

## 2022-03-30 DIAGNOSIS — N6489 Other specified disorders of breast: Secondary | ICD-10-CM | POA: Insufficient documentation

## 2022-03-30 DIAGNOSIS — M25611 Stiffness of right shoulder, not elsewhere classified: Secondary | ICD-10-CM | POA: Diagnosis not present

## 2022-03-30 DIAGNOSIS — M79601 Pain in right arm: Secondary | ICD-10-CM | POA: Insufficient documentation

## 2022-03-30 NOTE — Therapy (Signed)
[facility] [address], [place], [address] Phone: [phone]   Fax:  [phone]  Occupational Therapy Treatment  Patient Details  Name: Erika Figueroa MRN: [id number] Date of Birth: Nov 21, 1980 Referring Provider (OT): Dr. Peyton Najjar   Encounter Date: 03/30/2022   OT End of Session - 03/30/22 1102     Visit Number 2    Number of Visits 8    Date for OT Re-Evaluation 04/28/22    OT Start Time 1030    OT Stop Time 1054    OT Time Calculation (min) 24 min    Activity Tolerance Patient tolerated treatment well    Behavior During Therapy [facility] for tasks assessed/performed             Past Medical History:  Diagnosis Date   Anxiety    Cancer ([place])    Headache    Hypertension     Past Surgical History:  Procedure Laterality Date   BREAST SURGERY     biopsy right   MASTECTOMY W/ SENTINEL NODE BIOPSY Right 02/18/2022   Procedure: MASTECTOMY WITH SENTINEL LYMPH NODE BIOPSY;  Surgeon: Herbert Pun, MD;  Location: [facility] ORS;  Service: General;  Laterality: Right;   TUBAL LIGATION Bilateral 11/13/2017   Procedure: POST PARTUM TUBAL LIGATION;  Surgeon: Will Bonnet, MD;  Location: [facility] ORS;  Service: Gynecology;  Laterality: Bilateral;    There were no vitals filed for this visit.   Subjective Assessment - 03/30/22 1058     Subjective  I did see the surgeon after you,and did not had to do any aspiration.  I just needed you to show me what to do so my motion is much better.  I do feel still a slight  pull in the front of my chest.  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 We  will continue with symptomatic management. We discussed about possible aspiration if there is any increase of fluid.  -Medical oncology evaluation next week  -I will see her in 2 weeks for follow-up.     PLAN:   1. Continue the great care you are doing  2. Outpatient referral to Occupational Therapy  3. We will see you in 2 weeks for follow-up but contact us at any time if you have any concern   -patient do not need radiation as well as chemotherapy.    Patient Stated Goals I want my motion back in my right arm and strength so that I can reach up and, play with my kids, carrying (pushing pulling objects.  Not doing at the moment.  As well as the pain better    Currently in Pain? No/denies                 LYMPHEDEMA/ONCOLOGY QUESTIONNAIRE - 03/30/22 0001       Right Upper Extremity Lymphedema   15 cm Proximal to Olecranon Process 35.6 cm    10 cm Proximal to Olecranon Process 33 cm    Olecranon Process 29 cm    15 cm Proximal to Ulnar Styloid Process 24.5 cm    Just Proximal to Ulnar Styloid Process 16.5 cm  Left Upper Extremity Lymphedema   15 cm Proximal to Olecranon Process 35.5 cm    10 cm Proximal to Olecranon Process 33.5 cm    Olecranon Process 28.5 cm    15 cm Proximal to Ulnar Styloid Process 24 cm    Just Proximal to Ulnar Styloid Process 16.4 cm               We will reassess bilateral circumference in 6 weeks. But patient's risk for lymphedema  very low.  Did provide patient with a handout of the cancer journal about signs and symptoms and precautions for lymphedema at evaluation. Patient made great progress with home program since seen 2 weeks ago.  Bilateral shoulder active range of motion within normal limits for flexion, abduction and external rotation.  No pain but a slight pull over the chest or pec area.    Patient can continue with home program for range of motion for another 4 to 6 weeks.  Can do pulleys as well as in supine wand  exercises.   Patient can cont  some scar massage over mastectomy scar. Continue to wear unilateral postmastectomy Jovi pack under a camisole or bra as much as she can for the next 4-6 weeks.   Monitor for any lymphedema or swelling on chest wall underarm.                OT Education - 03/30/22 1102     Education Details Progress and changes in home program    Person(s) Educated Patient    Methods Explanation;Demonstration;Tactile cues;Verbal cues;Handout    Comprehension Verbal cues required;Returned demonstration;Verbalized understanding                 OT Long Term Goals - 03/17/22 1738       OT LONG TERM GOAL #1   Title Patient to be independent in home program to increase shoulder active range of motion in clinic planes to within functional limits that she can reach overhead to pull shirt off or into the With pain less than 2/10    Baseline Pain and discomfort is 6-7/10 in chest and axilla on the right.  Shoulder flexion 88 degrees, abduction 78, extension 35,    Time 4    Period Weeks    Status New    Target Date 04/14/22      OT LONG TERM GOAL #2   Title Patient independent in home program to do scar massage as well as wearing general back compression to decrease scar tissue, to less than 2/10 in the right chest    Baseline Scar still not healed tenderness and pain with shoulder range of motion 6-7/10.  Mastectomy scar adhere causing limited motion as well as discomfort.  Small to moderate seroma under the arm.  Patient to see surgeon Friday.    Time 6    Period Weeks    Status New    Target Date 04/28/22      OT LONG TERM GOAL #3   Title Patient right shoulder active range of motion and strength improved to prior level of function to return to ADLs and IADLs pain-free    Baseline Pain 6-7/10 in the right chest and axilla-scar adhesion with mastectomy-decrease shoulder abduction and flexion with pain less than 78-80 degrees of motion    Time 6    Period  Weeks    Status New    Target Date 04/28/22  Plan - 03/30/22 1103     Clinical Impression Statement Patient presented at OT evaluation about 4 weeks postop right mastectomy.  Breast cancer.  1 lymph node removed.  Patient do not need radiation and chemo.  Patient returned this date after doing home program for about a week to 2 weeks.  Patient now 5-1/2 weeks postop right mastectomy.  Patient showed fantastic active range of motion to WNL  in right shoulder - in all planes.  Patient's pain improved from a 6-7/10 to no pain this date. Slight pull over chest at end range over head.  Patient is low risk for lymphedema., but was educated with evaluation about signs and symptoms.  Patient to continue with home program for range of motion for another 4 to 6 weeks.  Patient  had a small to moderate seroma on the left -but did not need aspiration the last visit-patient did get unilateral postmastectomy jovipak breast pad after last visit and is wearing it under bra or camisole.  Patient to continu  with wearing the Jovi pack also for another 6 weeks.  Patient to follow-up with me 1 more time in 6 weeks if no issues we will discharge patient.  Patient limited in functional use of right dominant upper extremity and ADLs and IADLs with mostly overhead activity as well as pushing pulling lifting and carrying.  Patient would benefit from skilled OT services.    OT Occupational Profile and History Problem Focused Assessment - Including review of records relating to presenting problem    Occupational performance deficits (Please refer to evaluation for details): ADL's;IADL's;Work;Play;Leisure;Social Participation    Body Structure / Function / Physical Skills ADL;Flexibility;Scar mobility;IADL;ROM;Edema;UE functional use;Pain;Strength;Decreased knowledge of precautions    Rehab Potential Good    Clinical Decision Making Limited treatment options, no task modification necessary     Comorbidities Affecting Occupational Performance: None    Modification or Assistance to Complete Evaluation  No modification of tasks or assist necessary to complete eval    OT Frequency Monthly    OT Duration 6 weeks    OT Treatment/Interventions Self-care/ADL training;Manual lymph drainage;Therapeutic exercise;Scar mobilization;Passive range of motion;Patient/family education;Manual Therapy    Consulted and Agree with Plan of Care Patient             Patient will benefit from skilled therapeutic intervention in order to improve the following deficits and impairments:   Body Structure / Function / Physical Skills: ADL, Flexibility, Scar mobility, IADL, ROM, Edema, UE functional use, Pain, Strength, Decreased knowledge of precautions       Visit Diagnosis: Stiffness of right shoulder, not elsewhere classified  S/P right mastectomy  Pain in right arm  Scar tissue  Seroma of breast    Problem List 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 [place], [address] Phone: [phone]   Fax:  [phone]  Name: Erika Figueroa MRN: [id number] Date of Birth: 1981-05-02

## 2022-06-05 ENCOUNTER — Encounter: Payer: Self-pay | Admitting: Oncology

## 2022-06-06 NOTE — Progress Notes (Unsigned)
PCP:  Chad Cordial, PA-C   No chief complaint on file.  CMP due with ONC  HPI:      [name] is a 41 y.o. [id number] whose LMP was No LMP recorded. (Menstrual status: Other)., presents today for her annual examination.  Her menses are {norm/abn:715}, lasting {number: 22536} days.  Dysmenorrhea {dysmen:716}. She {does:18564} have intermenstrual bleeding.  Sex activity: {sex active: 315163}.  Last Pap: {AVWU:981191478}  Results were: {norm/abn:16707::"no abnormalities"} /neg HPV DNA *** Hx of STDs: {STD hx:14358}  Last mammogram: {date:304500300}  Results were: {norm/abn:13465} There is no FH of breast cancer. There is no FH of ovarian cancer. The patient {does:18564} do self-breast exams.  Tobacco use: {tob:20664} Alcohol use: {Alcohol:11675} No drug use.  Exercise: {exercise:31265}  She {does:18564} get adequate calcium and Vitamin D in her diet.  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   Genetic testing 02/19/2022   Mass of upper inner quadrant of right breast 12/11/2021   Encounter for sterilization 11/13/2017   Macrosomia 11/12/2017   VAGINITIS 12/26/2009   OVERWEIGHT 12/04/2009   HYPERTENSION 12/04/2009   ALLERGIC RHINITIS 12/04/2009   FATIGUE 12/04/2009    Past Surgical History:  Procedure Laterality Date   BREAST SURGERY     biopsy right   MASTECTOMY W/ SENTINEL NODE BIOPSY Right 02/18/2022   Procedure: MASTECTOMY WITH SENTINEL LYMPH NODE BIOPSY;  Surgeon: Herbert Pun, MD;  Location: [facility] ORS;  Service: General;  Laterality: Right;   TUBAL LIGATION Bilateral 11/13/2017   Procedure: POST PARTUM TUBAL LIGATION;  Surgeon: Will Bonnet, MD;  Location: [facility] ORS;  Service: Gynecology;  Laterality: Bilateral;    Family History  Problem Relation Age of Onset   Diabetes Mother    ALS Mother    Hypertension Father    Alzheimer's disease Father    Hyperlipidemia Father     Dementia Father    Hypothyroidism Sister    Breast cancer Sister 5       no mass, but did hormone tx   Hyperlipidemia Brother    Hypertension Brother    Diabetes Maternal Aunt    Breast cancer Cousin        second paternal cousin    Social History   Socioeconomic History   Marital status: Married    Spouse name: Reggie   Number of children: 2   Years of education: Not on file   Highest education level: Not on file  Occupational History   Occupation: Administrator  Tobacco Use   Smoking status: Never   Smokeless tobacco: Never  Vaping Use   Vaping Use: Never used  Substance and Sexual Activity   Alcohol use: No    Comment: prior to positive ICON   Drug use: No   Sexual activity: Yes    Partners: Male    Birth control/protection: Surgical    Comment: Tubal Ligation  Other Topics Concern   Not on file  Social History Narrative   Not on file   Social Determinants of Health   Financial Resource Strain: Low Risk  (03/02/2022)   Overall Financial Resource Strain (CARDIA)    Difficulty of Paying Living Expenses: Not very hard  Food Insecurity: No Food Insecurity (03/02/2022)   Hunger Vital Sign    Worried About Running Out of Food in the Last Year: Never true    Ran Out of Food in the Last Year: Never true  Transportation  Needs: No Transportation Needs (03/02/2022)   PRAPARE - Hydrologist (Medical): No    Lack of Transportation (Non-Medical): No  Physical Activity: Unknown (03/02/2022)   Exercise Vital Sign    Days of Exercise per Week: Patient refused    Minutes of Exercise per Session: Patient refused  Stress: Stress Concern Present (03/02/2022)   [place]    Feeling of Stress : To some extent  Social Connections: Moderately Integrated (03/02/2022)   Social Connection and Isolation Panel [NHANES]    Frequency of Communication with Friends and Family: Three times a  week    Frequency of Social Gatherings with Friends and Family: Three times a week    Attends Religious Services: 1 to 4 times per year    Active Member of Clubs or Organizations: No    Attends Archivist Meetings: Never    Marital Status: Married  Human resources officer Violence: Not At Risk (03/02/2022)   Humiliation, Afraid, Rape, and Kick questionnaire    Fear of Current or Ex-Partner: No    Emotionally Abused: No    Physically Abused: No    Sexually Abused: No     Current Outpatient Medications:    diazepam (VALIUM) 2 MG tablet, Take 1 tablet (2 mg total) by mouth every 12 (twelve) hours as needed for muscle spasms., Disp: 20 tablet, Rfl: 0   gabapentin (NEURONTIN) 300 MG capsule, Take 1 capsule (300 mg total) by mouth 3 (three) times daily for 10 days., Disp: 30 capsule, Rfl: 0   HYDROcodone-acetaminophen (NORCO/VICODIN) 5-325 MG tablet, Take 1-2 tablets by mouth every 4 (four) hours as needed for moderate pain., Disp: 30 tablet, Rfl: 0   ondansetron (ZOFRAN-ODT) 4 MG disintegrating tablet, Take 1 tablet (4 mg total) by mouth every 8 (eight) hours as needed for nausea or vomiting., Disp: 20 tablet, Rfl: 0   tamoxifen (NOLVADEX) 20 MG tablet, Take 1 tablet (20 mg total) by mouth daily., Disp: 30 tablet, Rfl: 3     ROS:  Review of Systems BREAST: No symptoms   Objective: There were no vitals taken for this visit.   OBGyn Exam  Results: No results found for this or any previous visit (from the past 24 hour(s)).  Assessment/Plan: No diagnosis found.  No orders of the defined types were placed in this encounter.            GYN counsel {counseling: 16159}     F/U  No follow-ups on file.  [name], PA-C 06/06/2022 1:07 PM

## 2022-06-07 ENCOUNTER — Encounter: Payer: Self-pay | Admitting: *Deleted

## 2022-06-08 ENCOUNTER — Encounter: Payer: Self-pay | Admitting: Obstetrics and Gynecology

## 2022-06-09 ENCOUNTER — Encounter: Payer: Self-pay | Admitting: Obstetrics and Gynecology

## 2022-06-09 ENCOUNTER — Inpatient Hospital Stay: Payer: BC Managed Care – PPO | Attending: Oncology

## 2022-06-09 ENCOUNTER — Ambulatory Visit (INDEPENDENT_AMBULATORY_CARE_PROVIDER_SITE_OTHER): Payer: BC Managed Care – PPO | Admitting: Obstetrics and Gynecology

## 2022-06-09 ENCOUNTER — Encounter: Payer: Self-pay | Admitting: Oncology

## 2022-06-09 ENCOUNTER — Inpatient Hospital Stay: Payer: BC Managed Care – PPO | Admitting: Oncology

## 2022-06-09 VITALS — BP 120/80 | Ht 72.0 in | Wt 236.0 lb

## 2022-06-09 VITALS — BP 160/107 | HR 102 | Temp 97.3°F | Resp 18 | Wt 235.5 lb

## 2022-06-09 DIAGNOSIS — I10 Essential (primary) hypertension: Secondary | ICD-10-CM | POA: Diagnosis not present

## 2022-06-09 DIAGNOSIS — D0511 Intraductal carcinoma in situ of right breast: Secondary | ICD-10-CM

## 2022-06-09 DIAGNOSIS — Z79810 Long term (current) use of selective estrogen receptor modulators (SERMs): Secondary | ICD-10-CM | POA: Diagnosis not present

## 2022-06-09 DIAGNOSIS — Z9229 Personal history of other drug therapy: Secondary | ICD-10-CM | POA: Diagnosis not present

## 2022-06-09 DIAGNOSIS — Z0000 Encounter for general adult medical examination without abnormal findings: Secondary | ICD-10-CM

## 2022-06-09 DIAGNOSIS — Z1231 Encounter for screening mammogram for malignant neoplasm of breast: Secondary | ICD-10-CM | POA: Diagnosis not present

## 2022-06-09 DIAGNOSIS — Z5181 Encounter for therapeutic drug level monitoring: Secondary | ICD-10-CM

## 2022-06-09 DIAGNOSIS — N6312 Unspecified lump in the right breast, upper inner quadrant: Secondary | ICD-10-CM

## 2022-06-09 DIAGNOSIS — Z08 Encounter for follow-up examination after completed treatment for malignant neoplasm: Secondary | ICD-10-CM

## 2022-06-09 DIAGNOSIS — Z86000 Personal history of in-situ neoplasm of breast: Secondary | ICD-10-CM | POA: Diagnosis not present

## 2022-06-09 DIAGNOSIS — Z01419 Encounter for gynecological examination (general) (routine) without abnormal findings: Secondary | ICD-10-CM | POA: Diagnosis not present

## 2022-06-09 LAB — COMPREHENSIVE METABOLIC PANEL
ALT: 27 U/L (ref 0–44)
AST: 27 U/L (ref 15–41)
Albumin: 3.9 g/dL (ref 3.5–5.0)
Alkaline Phosphatase: 54 U/L (ref 38–126)
Anion gap: 6 (ref 5–15)
BUN: 14 mg/dL (ref 6–20)
CO2: 24 mmol/L (ref 22–32)
Calcium: 8.6 mg/dL — ABNORMAL LOW (ref 8.9–10.3)
Chloride: 110 mmol/L (ref 98–111)
Creatinine, Ser: 0.96 mg/dL (ref 0.44–1.00)
GFR, Estimated: >60 mL/min (ref >60)
Glucose, Bld: 103 mg/dL — ABNORMAL HIGH (ref 70–99)
Potassium: 3.7 mmol/L (ref 3.5–5.1)
Sodium: 140 mmol/L (ref 135–145)
Total Bilirubin: 0.6 mg/dL (ref 0.3–1.2)
Total Protein: 7 g/dL (ref 6.5–8.1)

## 2022-06-09 NOTE — Progress Notes (Signed)
Hematology/Oncology Consult note [facility]  Telephone:([phone] [phone]  Patient Care Team: Copland, Ginette Otto as PCP - General (Obstetrics and Gynecology) Theodore Demark, RN (Inactive) as Oncology Nurse Navigator Daiva Huge, RN as Oncology Nurse Navigator   Name of the patient: Erika Figueroa  [id number]  16-Jul-1981   Date of visit: 06/09/22  Diagnosis- right breast DCIS ER positive  Chief complaint/ Reason for visit-routine follow-up of right breast DCIS on tamoxifen  Heme/Onc history:  Patient is a 41 year old female who underwent a diagnostic bilateral mammogram in April 2023 after she self palpated a right breast lump.  Mammogram and ultrasound showed numerous suspicious masses spanning the 3:00 axis in the right breast anterior to posterior depth 6 to 7 cm mammographically.  2 of the largest masses measured 2.3 x 1 x 1 cm and the other 1 measuring 1.4 x 1.3 x 0.8 cm.  No suspicious right axillary adenopathy.  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  Has seen Dr. Peyton Najjar and plan is to proceed with lumpectomy and sentinel lymph node biopsy   Menarche at 64, she is currently premenopausal.  She is G2 P2 and currently does not use any birth control.  No abnormal breast biopsies or mammograms in the past.  Family history significant for her sister with possible DCIS but was tested for BRCA and was negative   Patient underwent a right simple mastectomy on 02/18/2022 which showed a 7.3 cm grade 2 DCIS with unifocal positive anterior margin.  1 lymph node negative for malignancy.  No invasive cancer identified.  Interval history-patient is tolerating tamoxifen well.  She has occasional hot flashes especially at night which are often self-limited.  Denies new complaints at this time.  Her prior menstrual cycle was somewhat  lighter than her usual cycles after starting tamoxifen.  ECOG PS- 0 Pain scale- 0   Review of systems- Review of Systems  Constitutional:  Negative for chills, fever, malaise/fatigue and weight loss.  HENT:  Negative for congestion, ear discharge and nosebleeds.   Eyes:  Negative for blurred vision.  Respiratory:  Negative for cough, hemoptysis, sputum production, shortness of breath and wheezing.   Cardiovascular:  Negative for chest pain, palpitations, orthopnea and claudication.  Gastrointestinal:  Negative for abdominal pain, blood in stool, constipation, diarrhea, heartburn, melena, nausea and vomiting.  Genitourinary:  Negative for dysuria, flank pain, frequency, hematuria and urgency.  Musculoskeletal:  Negative for back pain, joint pain and myalgias.  Skin:  Negative for rash.  Neurological:  Negative for dizziness, tingling, focal weakness, seizures, weakness and headaches.  Endo/Heme/Allergies:  Does not bruise/bleed easily.  Psychiatric/Behavioral:  Negative for depression and suicidal ideas. The patient does not have insomnia.       No Known Allergies   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+   Headache    Hypertension      Past Surgical History:  Procedure Laterality Date   BREAST SURGERY     biopsy right   MASTECTOMY W/ SENTINEL NODE BIOPSY Right 02/18/2022   Procedure: MASTECTOMY WITH SENTINEL LYMPH NODE BIOPSY;  Surgeon: Herbert Pun, MD;  Location: [facility] ORS;  Service: General;  Laterality: Right;   TUBAL LIGATION Bilateral 11/13/2017   Procedure: POST PARTUM TUBAL LIGATION;  Surgeon: Will Bonnet, MD;  Location: [facility] ORS;  Service: Gynecology;  Laterality: Bilateral;    Social History   Socioeconomic History   Marital status: Married    Spouse name: Reggie   Number of children: 2   Years of education: Not on file   Highest education level: Not on file  Occupational  History   Occupation: Administrator  Tobacco Use   Smoking status: Never   Smokeless tobacco: Never  Vaping Use   Vaping Use: Never used  Substance and Sexual Activity   Alcohol use: No    Comment: prior to positive ICON   Drug use: No   Sexual activity: Yes    Partners: Male    Birth control/protection: Surgical    Comment: Tubal Ligation  Other Topics Concern   Not on file  Social History Narrative   Not on file   Social Determinants of Health   Financial Resource Strain: Low Risk  (03/02/2022)   Overall Financial Resource Strain (CARDIA)    Difficulty of Paying Living Expenses: Not very hard  Food Insecurity: No Food Insecurity (03/02/2022)   Hunger Vital Sign    Worried About Running Out of Food in the Last Year: Never true    Ran Out of Food in the Last Year: Never true  Transportation Needs: No Transportation Needs (03/02/2022)   PRAPARE - Hydrologist (Medical): No    Lack of Transportation (Non-Medical): No  Physical Activity: Unknown (03/02/2022)   Exercise Vital Sign    Days of Exercise per Week: Patient refused    Minutes of Exercise per Session: Patient refused  Stress: Stress Concern Present (03/02/2022)   [place]    Feeling of Stress : To some extent  Social Connections: Moderately Integrated (03/02/2022)   Social Connection and Isolation Panel [NHANES]    Frequency of Communication with Friends and Family: Three times a week    Frequency of Social Gatherings with Friends and Family: Three times a week    Attends Religious Services: 1 to 4 times per year    Active Member of Clubs or Organizations: No    Attends Archivist Meetings: Never    Marital Status: Married  Human resources officer Violence: Not At Risk (03/02/2022)   Humiliation, Afraid, Rape, and Kick questionnaire    Fear of Current or Ex-Partner: No    Emotionally Abused: No    Physically  Abused: No    Sexually Abused: No    Family History  Problem Relation Age of Onset   Diabetes Mother    ALS Mother    Hypertension Father    Alzheimer's disease Father    Hyperlipidemia Father    Dementia Father    Hypothyroidism Sister    Breast cancer Sister 20       no mass, but did hormone tx   Hyperlipidemia Brother    Hypertension Brother    Diabetes Maternal Aunt    Breast cancer Cousin        second paternal cousin     Current Outpatient Medications:    cholecalciferol (VITAMIN D3) 25 MCG (1000 UNIT) tablet, Take 1,000 Units by mouth daily., Disp: , Rfl:    Multiple Vitamins-Minerals (WOMENS MULTI VITAMIN & MINERAL PO), Take by mouth., Disp: , Rfl:    tamoxifen (NOLVADEX) 20 MG tablet, Take 1 tablet (20 mg total) by mouth daily., Disp: 30 tablet, Rfl: 3  Physical exam:  Vitals:  06/09/22 1327  BP: (!) 160/107  Pulse: (!) 102  Resp: 18  Temp: (!) 97.3 F (36.3 C)  SpO2: 100%  Weight: 235 lb 8 oz (106.8 kg)   Physical Exam HENT:     Head: Normocephalic and atraumatic.  Eyes:     Pupils: Pupils are equal, round, and reactive to light.  Cardiovascular:     Rate and Rhythm: Normal rate and regular rhythm.     Heart sounds: Normal heart sounds.  Pulmonary:     Effort: Pulmonary effort is normal.     Breath sounds: Normal breath sounds.  Abdominal:     General: Bowel sounds are normal.     Palpations: Abdomen is soft.  Musculoskeletal:     Cervical back: Normal range of motion.  Skin:    General: Skin is warm and dry.  Neurological:     Mental Status: She is alert and oriented to person, place, and time.         Latest Ref Rng & Units 06/09/2022   12:59 PM  CMP  Glucose 70 - 99 mg/dL 103   BUN 6 - 20 mg/dL 14   Creatinine 0.44 - 1.00 mg/dL 0.96   Sodium 135 - 145 mmol/L 140   Potassium 3.5 - 5.1 mmol/L 3.7   Chloride 98 - 111 mmol/L 110   CO2 22 - 32 mmol/L 24   Calcium 8.9 - 10.3 mg/dL 8.6   Total Protein 6.5 - 8.1 g/dL 7.0   Total  Bilirubin 0.3 - 1.2 mg/dL 0.6   Alkaline Phos 38 - 126 U/L 54   AST 15 - 41 U/L 27   ALT 0 - 44 U/L 27       Latest Ref Rng & Units 02/19/2022    5:46 AM  CBC  WBC 4.0 - 10.5 K/uL 10.7   Hemoglobin 12.0 - 15.0 g/dL 12.9   Hematocrit 36.0 - 46.0 % 38.3   Platelets 150 - 400 K/uL 339      Assessment and plan- Patient is a 41 y.o. female with history of ER positive right breast DCIS s/p right mastectomy currently on tamoxifen here for routine follow-up  Patient is tolerating tamoxifen well without any significant side effects.  She does have occasional hot flashes which seems self-limited at this time and I do not think that we need to start her on any medications for the same.  Given that she is premenopausal AI's are not an option for her.  I will see her back in 4 months no labs.  She will continue to get surveillance left breast mammograms and she will be due for her next one in April 2024   Visit Diagnosis 1. Encounter for follow-up surveillance of ductal carcinoma in situ (DCIS) of breast   2. Encounter for monitoring tamoxifen therapy      Dr. Randa Evens, MD, MPH [facility] [phone] 06/09/2022 4:32 PM

## 2022-06-09 NOTE — Patient Instructions (Signed)
I value your feedback and you entrusting us with your care. If you get a [place] [facility] patient survey, I would appreciate you taking the time to let us know about your experience today. Thank you! ? ? ?

## 2022-06-10 ENCOUNTER — Encounter: Payer: Self-pay | Admitting: Obstetrics and Gynecology

## 2022-06-10 DIAGNOSIS — Z0000 Encounter for general adult medical examination without abnormal findings: Secondary | ICD-10-CM

## 2022-06-10 DIAGNOSIS — Z13220 Encounter for screening for lipoid disorders: Secondary | ICD-10-CM

## 2022-06-10 DIAGNOSIS — Z6832 Body mass index (BMI) 32.0-32.9, adult: Secondary | ICD-10-CM

## 2022-06-10 DIAGNOSIS — Z131 Encounter for screening for diabetes mellitus: Secondary | ICD-10-CM

## 2022-06-12 ENCOUNTER — Other Ambulatory Visit: Payer: BC Managed Care – PPO

## 2022-06-16 ENCOUNTER — Other Ambulatory Visit: Payer: BC Managed Care – PPO

## 2022-06-16 DIAGNOSIS — Z0000 Encounter for general adult medical examination without abnormal findings: Secondary | ICD-10-CM

## 2022-06-16 DIAGNOSIS — Z13220 Encounter for screening for lipoid disorders: Secondary | ICD-10-CM

## 2022-06-16 DIAGNOSIS — Z6832 Body mass index (BMI) 32.0-32.9, adult: Secondary | ICD-10-CM

## 2022-06-16 DIAGNOSIS — Z131 Encounter for screening for diabetes mellitus: Secondary | ICD-10-CM | POA: Diagnosis not present

## 2022-06-17 LAB — HEMOGLOBIN A1C
Est. average glucose Bld gHb Est-mCnc: 103 mg/dL
Hgb A1c MFr Bld: 5.2 % (ref 4.8–5.6)

## 2022-06-17 LAB — LIPID PANEL
Chol/HDL Ratio: 2.6 ratio (ref 0.0–4.4)
Cholesterol, Total: 169 mg/dL (ref 100–199)
HDL: 66 mg/dL (ref >39)
LDL Chol Calc (NIH): 85 mg/dL (ref 0–99)
Triglycerides: 100 mg/dL (ref 0–149)
VLDL Cholesterol Cal: 18 mg/dL (ref 5–40)

## 2022-07-05 IMAGING — MG MM BREAST LOCALIZATION CLIP
4 series · 4 of 12 positions shown · non-contrast
Comparison: Previous exam(s).

CLINICAL DATA: Status post ultrasound-guided core biopsy right
breast masses.

EXAM:
3D DIAGNOSTIC right MAMMOGRAM POST ULTRASOUND BIOPSY

[R ML synth-2D]
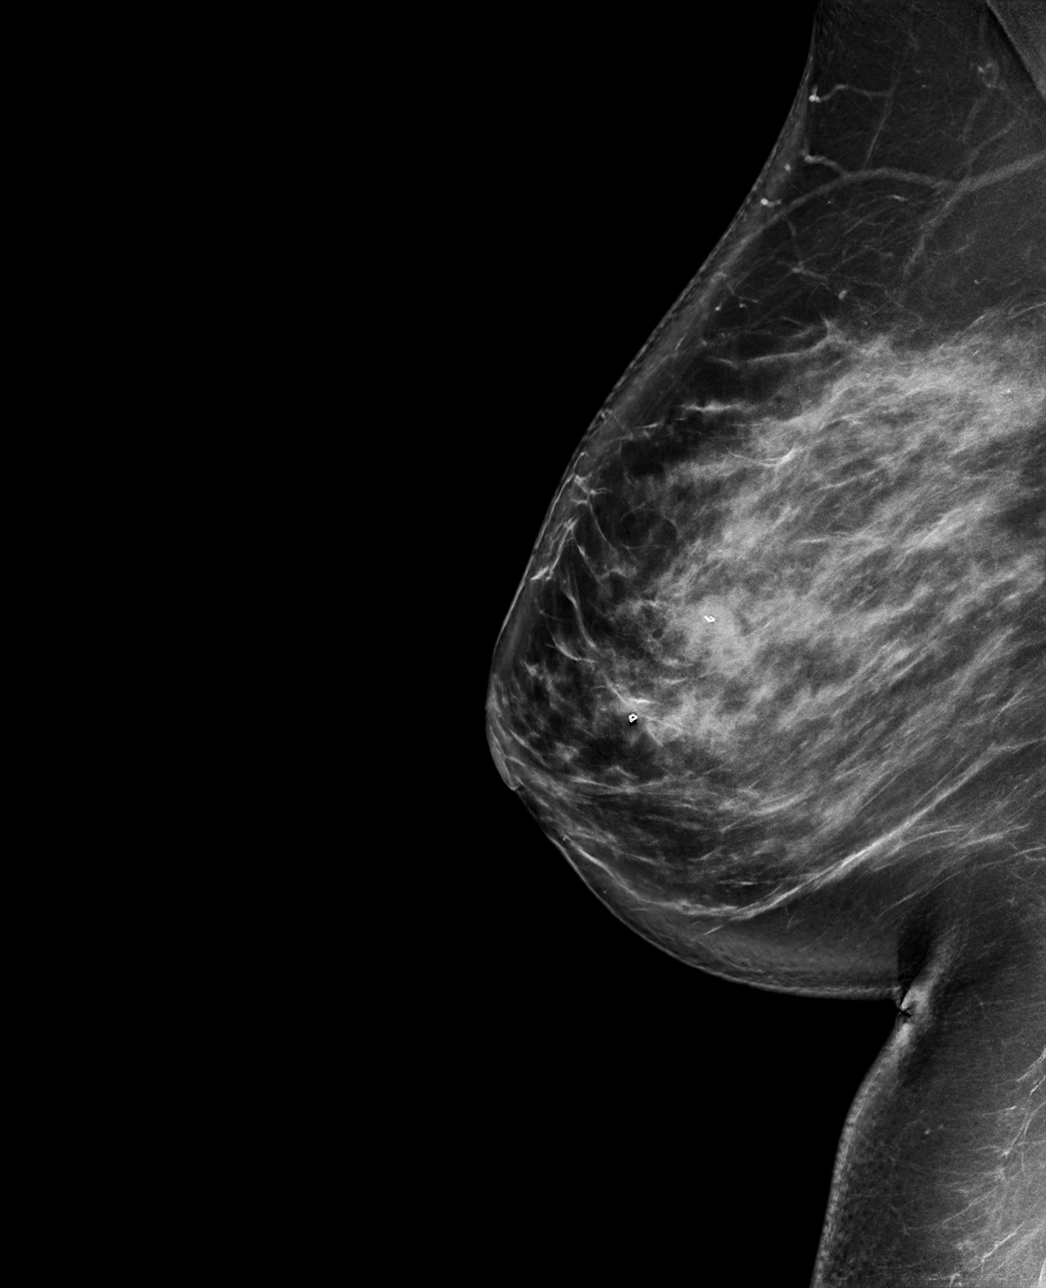

[R CC synth-2D]
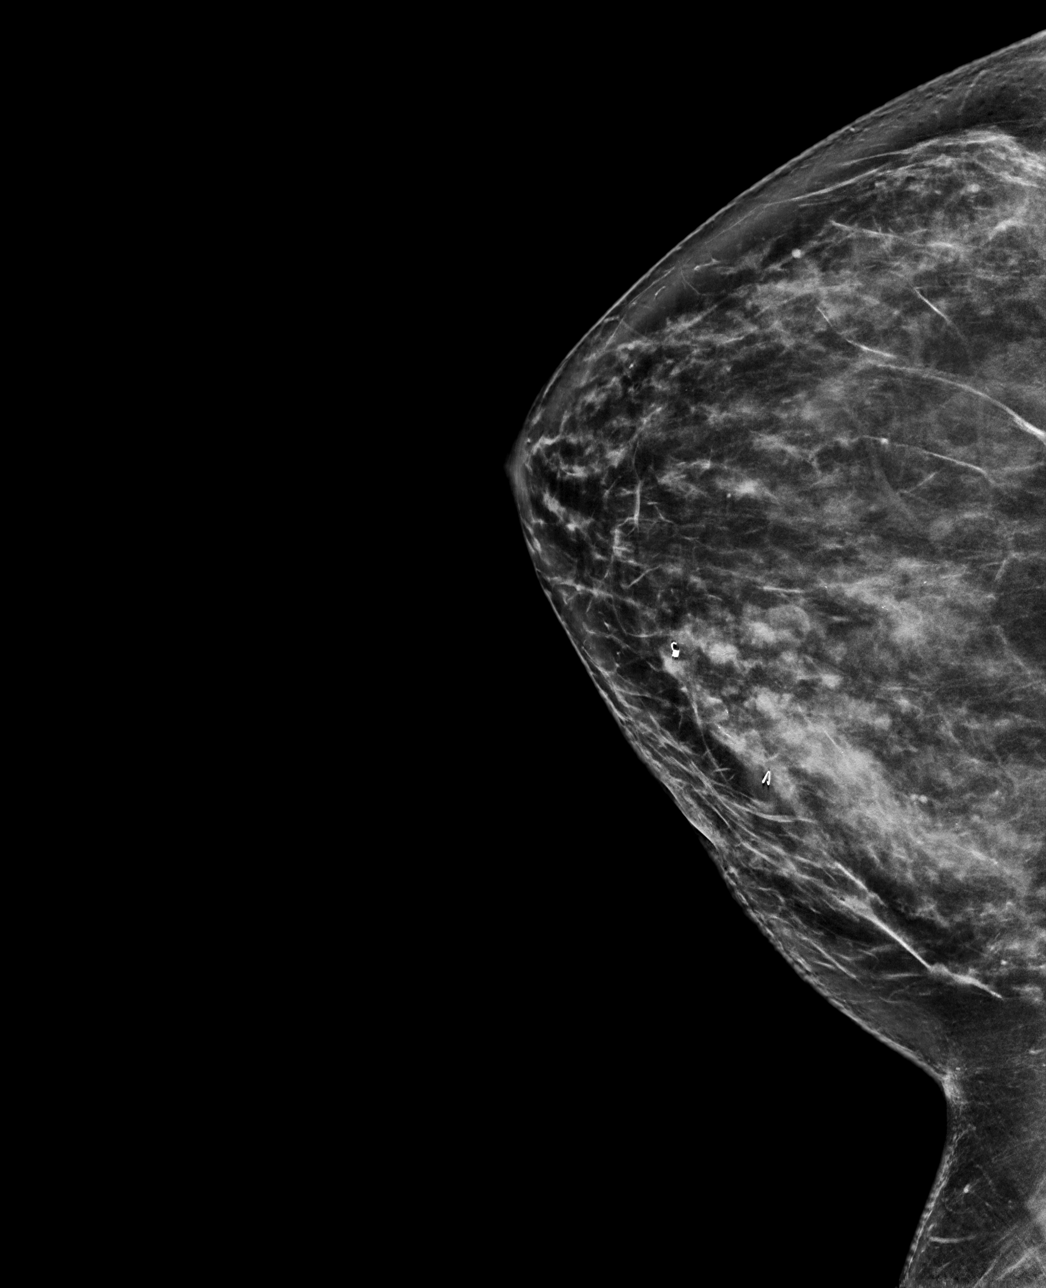

[R CC tomo · tomo slice 47/92.0]
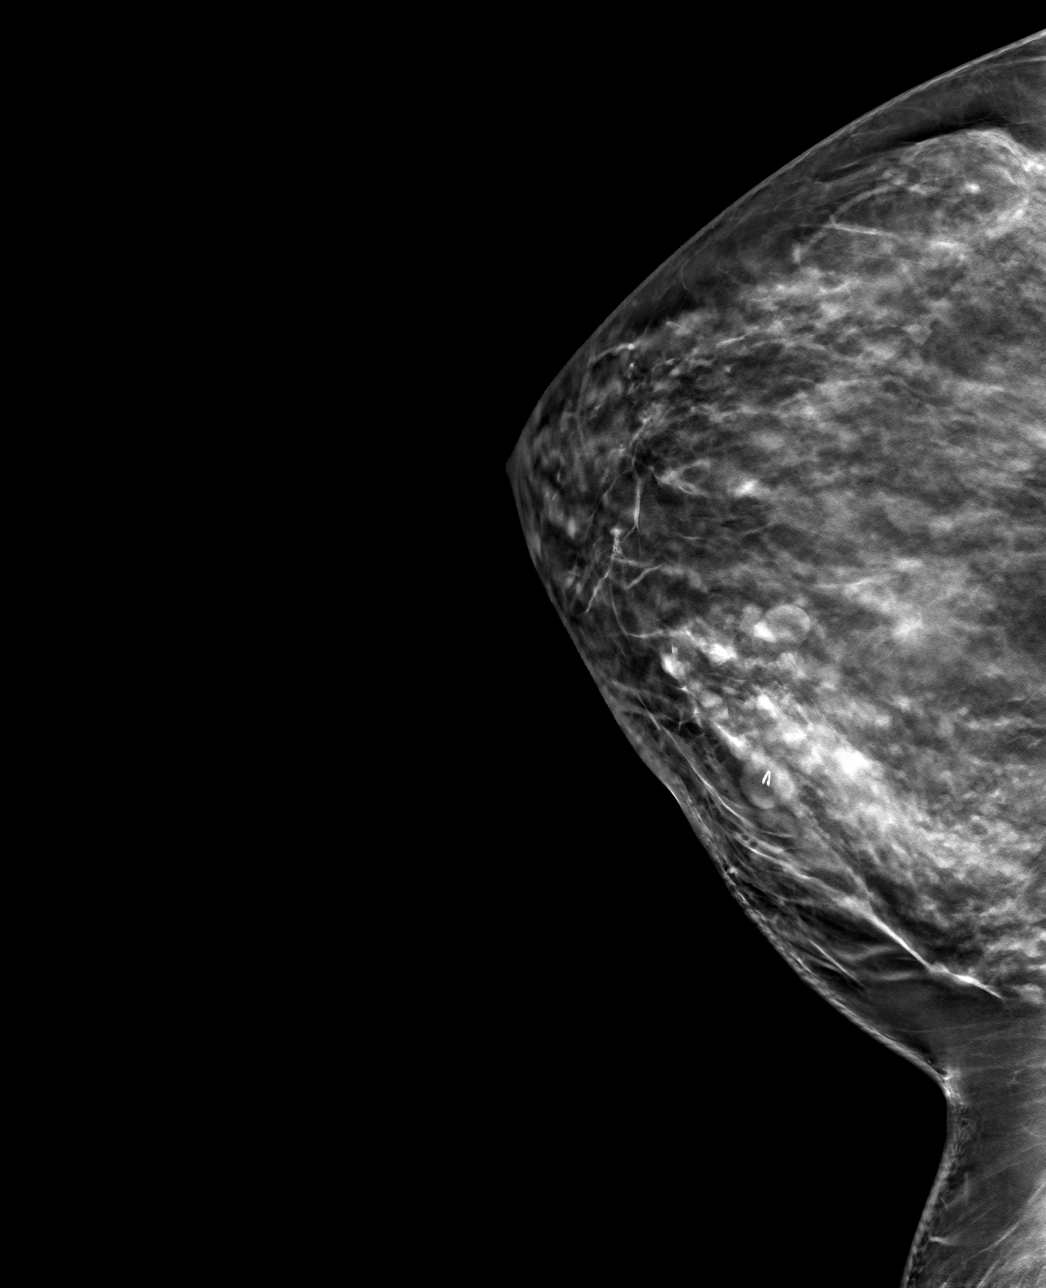

[R ML tomo · tomo slice 57/113.0]
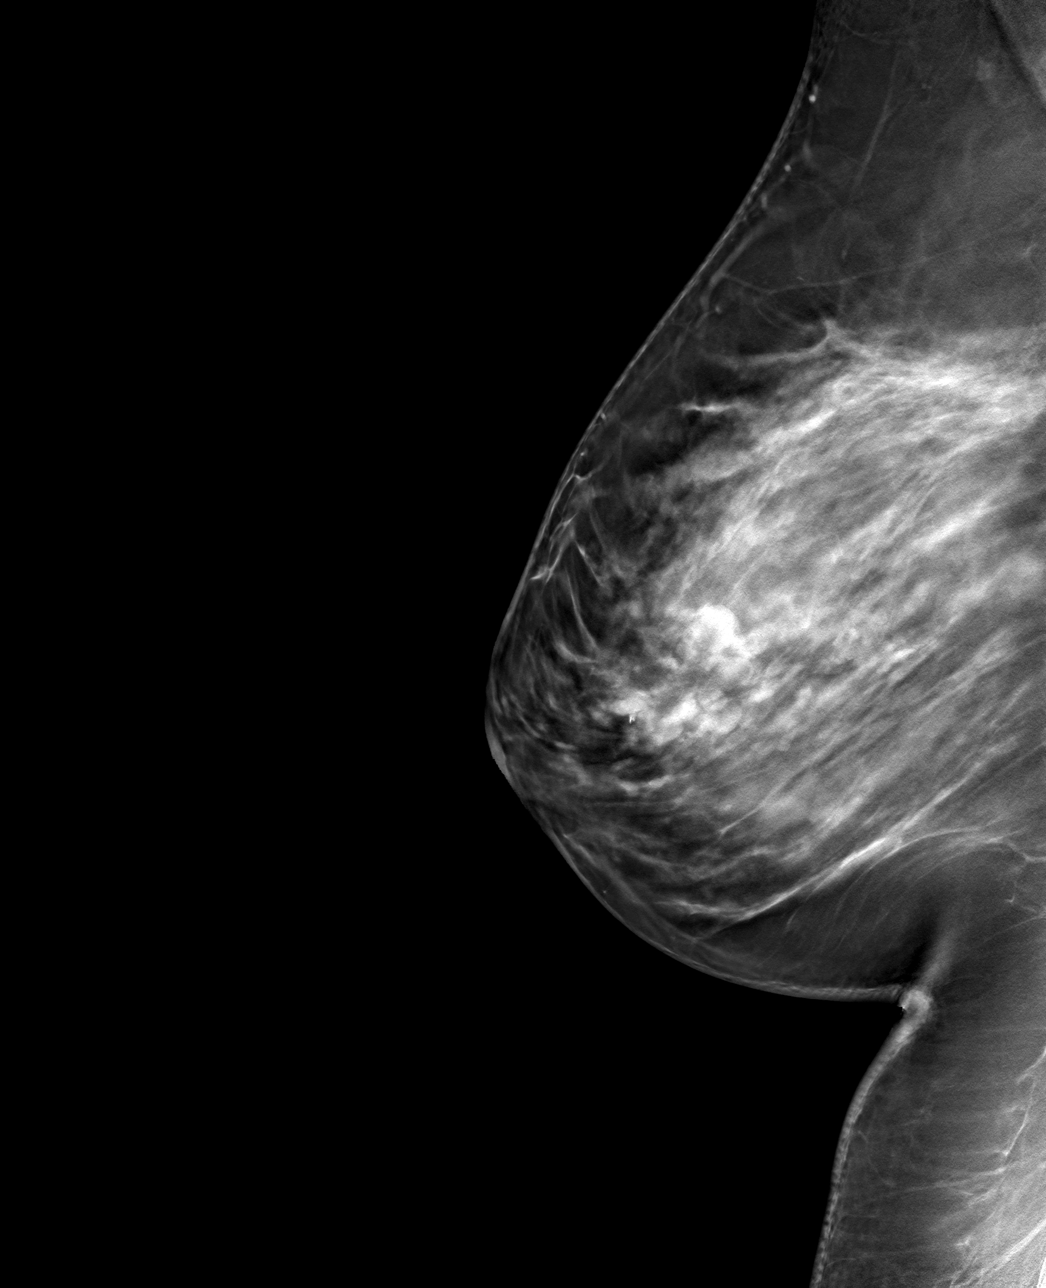

[4 of 12 positions shown; findings below may reference images not displayed]

FINDINGS: A: 3D Mammographic images were obtained following ultrasound guided
biopsy of mass at right breast 3 o'clock 2 cm from nipple. The coil
biopsy marking clip is in expected position at the site of biopsy.

B: 3D Mammographic images were obtained following ultrasound guided
biopsy of mass at right breast 3 o'clock 5 cm from nipple. The heart
biopsy marking clip is in expected position at the site of biopsy.
IMPRESSION: A: Appropriate positioning of the coil shaped biopsy marking clip at
the site of biopsy in the mass at right breast 3 o'clock 2 cm from
nipple.

B: Appropriate positioning of the Ying Cracker shaped biopsy marking clip at
the site of biopsy in the mass at 3 o'clock 5 cm from nipple.

Final Assessment: Post Procedure Mammograms for Marker Placement

## 2022-07-05 IMAGING — MG US BREAST BX W/ LOC DEV EA ADD LESION IMG BX SPEC US GUIDE*R*
1 series · 8 of 8 positions shown · non-contrast
Comparison: Previous exam(s).
COMPARISON: Previous exam(s).

Addendum:
CLINICAL DATA: Right breast masses for biopsy

EXAM:
ULTRASOUND GUIDED right BREAST CORE NEEDLE BIOPSY

[Series 1: MG view · 0.06mm/px · 8 of 19 slices shown]
[im 1/19]
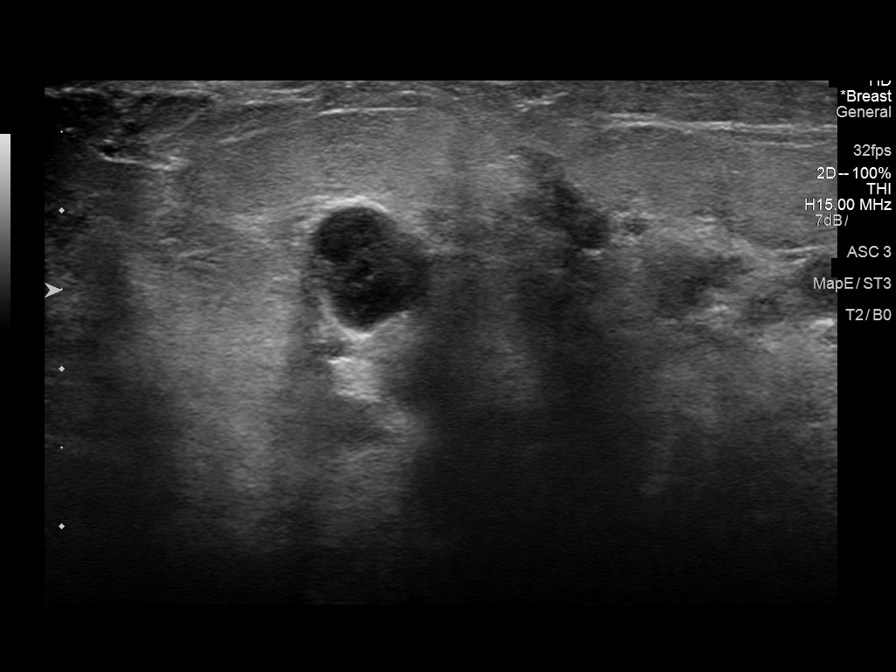
[im 3/19]
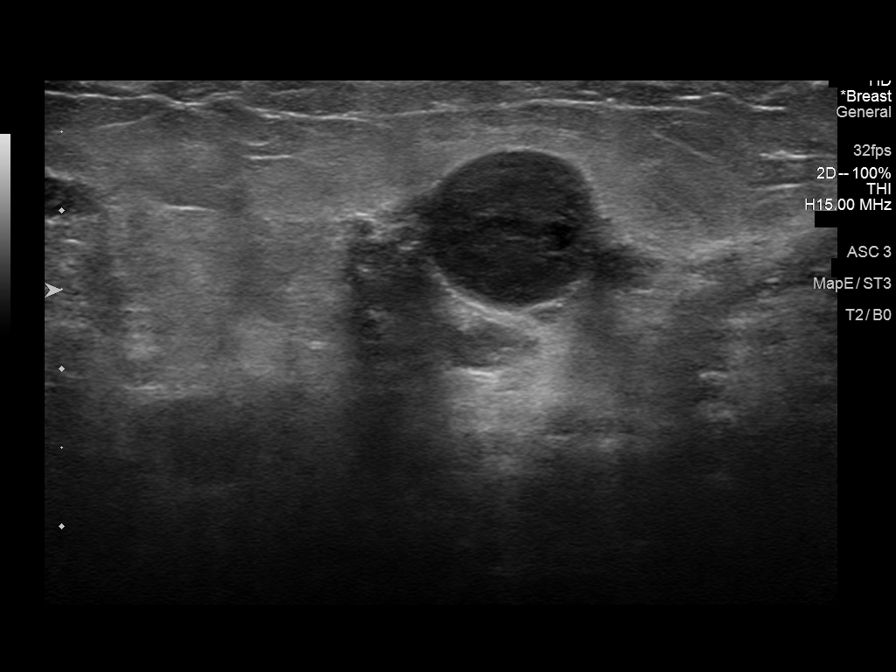
[im 6/19]
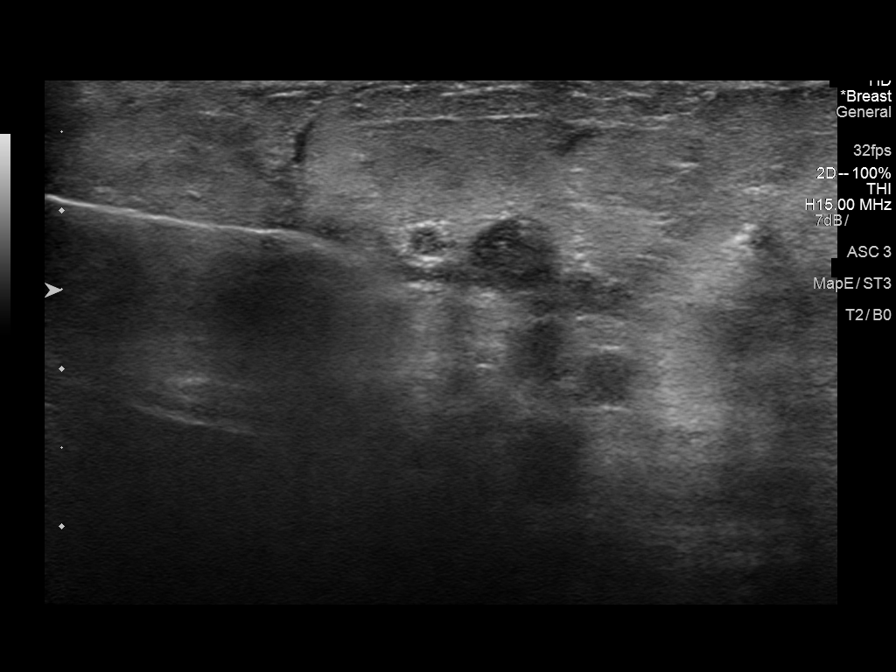
[im 8/19]
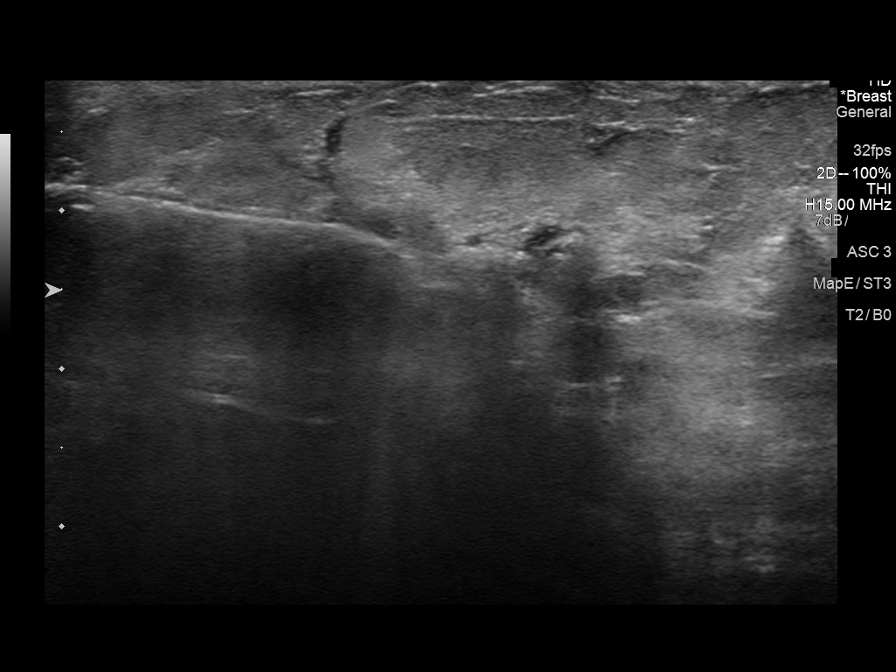
[im 11/19]
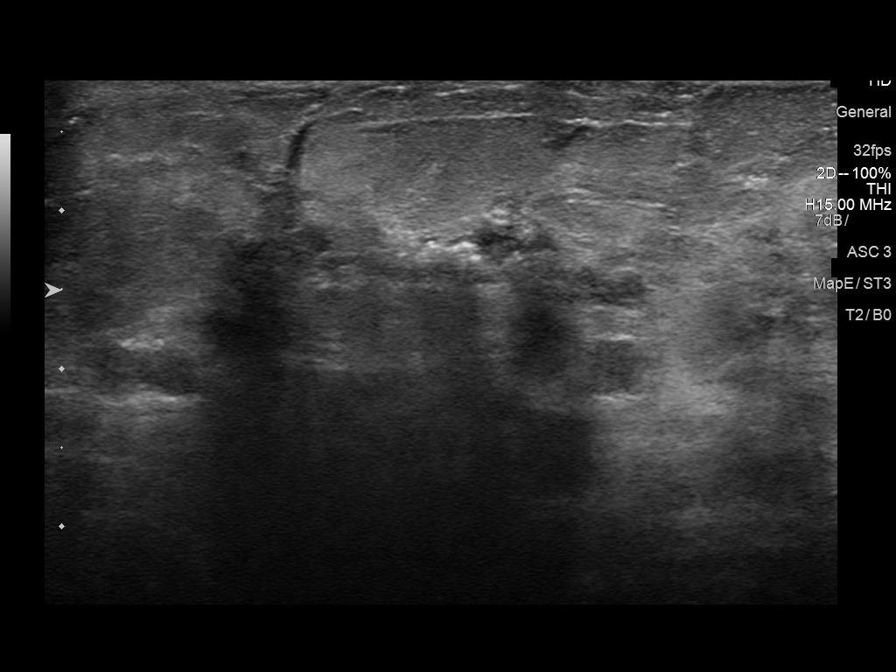
[im 13/19]
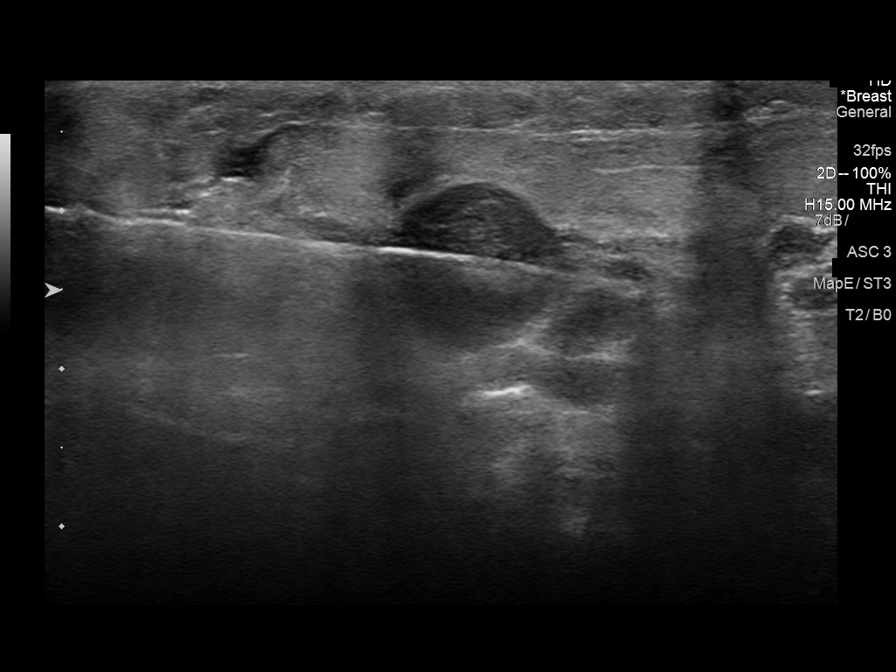
[im 16/19]
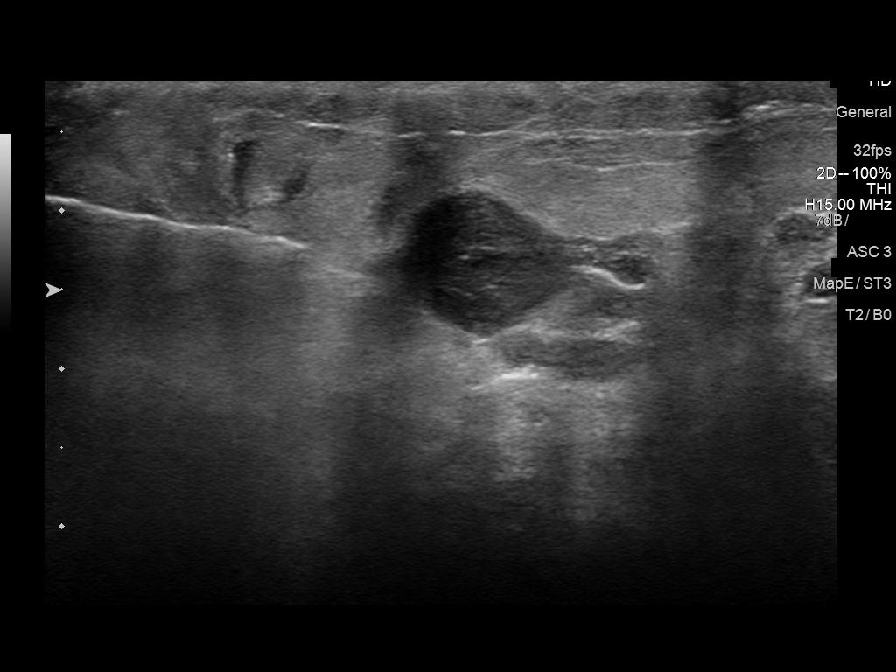
[im 19/19]
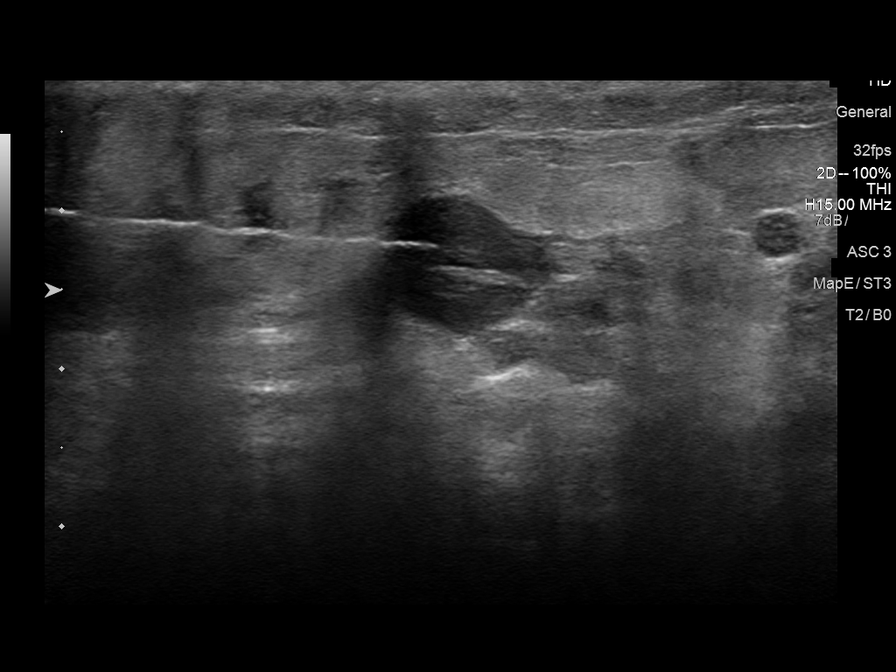

[8 of 8 positions shown; findings below may reference images not displayed]



A: Lesion quadrant: Right breast 3 o'clock 2 cm from nipple

Using sterile technique and 1% Lidocaine as local anesthetic, under
direct ultrasound visualization, a 12 gauge Brondonne device was
used to perform biopsy of mass at right breast 3 o'clock 2 cm from
nipple using a medial approach. At the conclusion of the procedure
coil tissue marker clip was deployed into the biopsy cavity. Follow
up 2 view mammogram was performed and dictated separately.

B: Lesion quadrant: Right breast 3 o'clock 5 cm from nipple

Using sterile technique and 1% Lidocaine as local anesthetic, under
direct ultrasound visualization, a 12 gauge Brondonne device was
used to perform biopsy of mass at right breast 3 o'clock 5 cm from
nipple using a medial approach. At the conclusion of the procedure
Elands tissue marker clip was deployed into the biopsy cavity. Follow
up 2 view mammogram was performed and dictated separately.
IMPRESSION: Ultrasound guided biopsies of right breast. No apparent
complications.

ADDENDUM:
A. Pathology revealed BREAST, RIGHT, 3 O'CLOCK 2 CM FROM NIPPLE
(COIL CLIP); ULTRASOUND-GUIDED CORE BIOPSY: DUCTAL CARCINOMA IN SITU
(DCIS), LOW GRADE, IN A FRAGMENTED SAMPLE FROM A COMPLEX LESION WITH
CYSTIC CHANGE AND SCLEROSIS. This was found to be concordant by Dr.
Sigisfredo Marcelo Uhlenhaut.

Soberanis. Pathology revealed BREAST, RIGHT, 3 O'CLOCK 5 CM FROM NIPPLE
(HEART CLIP); ULTRASOUND-GUIDED CORE BIOPSY: COMPLEX PAPILLARY
NEOPLASM, AT LEAST DCIS, LOW TO INTERMEDIATE GRADE. This was found
to be concordant by Dr. Sigisfredo Marcelo Uhlenhaut.

NOTE: Based on the core samples a definitive classification of the
neoplasm(s) cannot be provided. The lesions represent DCIS at least,
perhaps with a component of encapsulated papillary carcinoma (A) or
solid papillary carcinoma (B). Invasive carcinoma cannot be
excluded. Final diagnosis will need to be based on the excision
specimen.

Pathology results were discussed with the patient and her husband by
telephone. The patient reported doing well after the biopsies with
tenderness at the sites. Post biopsy instructions and care were
reviewed and questions were answered. The patient was encouraged to
call [HOSPITAL[facility]
for any additional concerns.

Surgical and medical oncologist referrals will be arranged by Laine
Arlan RN Oncology Navigator of [HOSPITAL] [HOSPITAL] of
[[facility]].

Recommend further evaluation with breast MRI given the extent of
disease.

Pathology results reported by Nazareth Jumper RN on 01/22/2022.



A: Lesion quadrant: Right breast 3 o'clock 2 cm from nipple

Using sterile technique and 1% Lidocaine as local anesthetic, under
direct ultrasound visualization, a 12 gauge Brondonne device was
used to perform biopsy of mass at right breast 3 o'clock 2 cm from
nipple using a medial approach. At the conclusion of the procedure
coil tissue marker clip was deployed into the biopsy cavity. Follow
up 2 view mammogram was performed and dictated separately.

B: Lesion quadrant: Right breast 3 o'clock 5 cm from nipple

Using sterile technique and 1% Lidocaine as local anesthetic, under
direct ultrasound visualization, a 12 gauge Brondonne device was
used to perform biopsy of mass at right breast 3 o'clock 5 cm from
nipple using a medial approach. At the conclusion of the procedure
Elands tissue marker clip was deployed into the biopsy cavity. Follow
up 2 view mammogram was performed and dictated separately.
IMPRESSION: Ultrasound guided biopsies of right breast. No apparent
complications.

## 2022-09-09 ENCOUNTER — Other Ambulatory Visit: Payer: Self-pay | Admitting: Oncology

## 2022-09-09 DIAGNOSIS — D0511 Intraductal carcinoma in situ of right breast: Secondary | ICD-10-CM

## 2022-10-12 ENCOUNTER — Inpatient Hospital Stay: Payer: BC Managed Care – PPO | Admitting: Oncology

## 2022-11-02 ENCOUNTER — Other Ambulatory Visit: Payer: Self-pay | Admitting: General Surgery

## 2022-11-02 DIAGNOSIS — Z1231 Encounter for screening mammogram for malignant neoplasm of breast: Secondary | ICD-10-CM

## 2023-01-08 ENCOUNTER — Ambulatory Visit
Admission: RE | Admit: 2023-01-08 | Discharge: 2023-01-08 | Disposition: A | Discharge status: Home / Self Care | Payer: BC Managed Care – PPO | Source: Ambulatory Visit | Attending: General Surgery | Admitting: General Surgery

## 2023-01-08 DIAGNOSIS — Z1231 Encounter for screening mammogram for malignant neoplasm of breast: Secondary | ICD-10-CM | POA: Diagnosis not present

## 2023-01-15 DIAGNOSIS — C50311 Malignant neoplasm of lower-inner quadrant of right female breast: Secondary | ICD-10-CM | POA: Diagnosis not present

## 2023-03-24 DIAGNOSIS — H5213 Myopia, bilateral: Secondary | ICD-10-CM | POA: Diagnosis not present

## 2023-08-26 ENCOUNTER — Ambulatory Visit: Payer: BC Managed Care – PPO | Admitting: Family Medicine

## 2023-09-28 ENCOUNTER — Encounter: Payer: Self-pay | Admitting: Family Medicine

## 2023-09-28 ENCOUNTER — Ambulatory Visit: Payer: BC Managed Care – PPO | Admitting: Family Medicine

## 2023-09-28 VITALS — BP 192/146 | HR 105 | Ht 73.0 in | Wt 246.0 lb

## 2023-09-28 DIAGNOSIS — R7301 Impaired fasting glucose: Secondary | ICD-10-CM | POA: Diagnosis not present

## 2023-09-28 DIAGNOSIS — E559 Vitamin D deficiency, unspecified: Secondary | ICD-10-CM

## 2023-09-28 DIAGNOSIS — E038 Other specified hypothyroidism: Secondary | ICD-10-CM | POA: Diagnosis not present

## 2023-09-28 DIAGNOSIS — Z114 Encounter for screening for human immunodeficiency virus [HIV]: Secondary | ICD-10-CM

## 2023-09-28 DIAGNOSIS — I10 Essential (primary) hypertension: Secondary | ICD-10-CM

## 2023-09-28 DIAGNOSIS — R000 Tachycardia, unspecified: Secondary | ICD-10-CM

## 2023-09-28 DIAGNOSIS — E7849 Other hyperlipidemia: Secondary | ICD-10-CM

## 2023-09-28 DIAGNOSIS — Z1159 Encounter for screening for other viral diseases: Secondary | ICD-10-CM

## 2023-09-28 MED ORDER — UNABLE TO FIND: 0 refills | Status: DC

## 2023-09-28 MED ORDER — TELMISARTAN 20 MG PO TABS: 20.0000 mg | ORAL_TABLET | Freq: Every day | ORAL | 1 refills | Status: DC

## 2023-09-28 MED ORDER — AMLODIPINE BESYLATE 5 MG PO TABS: 5.0000 mg | ORAL_TABLET | Freq: Every day | ORAL | 1 refills | Status: DC

## 2023-09-28 NOTE — Patient Instructions (Signed)
 I appreciate the opportunity to provide care to you today!    Follow up:  1 months BP  Labs: please stop by the lab tomorrow to get your blood drawn (CBC, CMP, TSH, Lipid profile, HgA1c, Vit D)  Screening: HIV and Hep C  Hypertension Management  Your current blood pressure is above the target goal of <140/90 mmHg. To address this, please start taking amlodipine  5 mg daily and Telmisartan  20 mg daily   Medication Instructions: Take your blood pressure medication at the same time each day. After taking your medication, check your blood pressure at least an hour later. If your first reading is >140/90 mmHg, wait at least 10 minutes and recheck your blood pressure. Side Effects: In the initial days of therapy, you may experience dizziness or lightheadedness as your body adjusts to the lower blood pressure; this is expected. Diet and Lifestyle: Adhere to a low-sodium diet, limiting intake to less than 1500 mg daily, and increase your physical activity. Avoid over-the-counter NSAIDs such as ibuprofen  and naproxen while on this medication. Hydration and Nutrition: Stay well-hydrated by drinking at least 64 ounces of water  daily. Increase your servings of fruits and vegetables and avoid excessive sodium in your diet. Long-Term Considerations: Uncontrolled hypertension can increase the risk of cardiovascular diseases, including stroke, coronary artery disease, and heart failure.  Please report to the emergency department if your blood pressure exceeds 180/120 and is accompanied by symptoms such as headaches, chest pain, palpitations, blurred vision, or dizziness.   Please stop by your local pharmacy and get your Tdap and Shingles vaccine  Referrals today-  Cardiology   Attached with your AVS, you will find valuable resources for self-education. I highly recommend dedicating some time to thoroughly examine them.   Please continue to a heart-healthy diet and increase your physical activities. Try to  exercise for at least five days a week.    It was a pleasure to see you and I look forward to continuing to work together on your health and well-being. Please do not hesitate to call the office if you need care or have questions about your care.  In case of emergency, please visit the Emergency Department for urgent care, or contact our clinic at [phone] to schedule an appointment. We're here to help you!   Have a wonderful day and week. With Gratitude, Jeramie Scogin MSN, FNP-BC

## 2023-09-28 NOTE — Progress Notes (Signed)
 New Patient Office Visit  Subjective:  Patient ID: Erika Figueroa, female    DOB: 28-Nov-1980  Age: 43 y.o. MRN: [id number]  CC:  Chief Complaint  Patient presents with   New Patient (Initial Visit)    Establishing care. Has bp concerns, reports having a headache today, was previously on bp medication in the past.     HPI Erika Figueroa is a 43 y.o. female with past medical history essential hypertension of  presents for establishing care. For the details of today's visit, please refer to the assessment and plan.     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+   Headache    Hypertension     Past Surgical History:  Procedure Laterality Date   BREAST BIOPSY Right 01/19/2022   positive   BREAST SURGERY     biopsy right   MASTECTOMY Right 02/18/2022   MASTECTOMY W/ SENTINEL NODE BIOPSY Right 02/18/2022   Procedure: MASTECTOMY WITH SENTINEL LYMPH NODE BIOPSY;  Surgeon: Rodolph Romano, MD;  Location: [facility] ORS;  Service: General;  Laterality: Right;   TUBAL LIGATION Bilateral 11/13/2017   Procedure: POST PARTUM TUBAL LIGATION;  Surgeon: Leonce Garnette BIRCH, MD;  Location: [facility] ORS;  Service: Gynecology;  Laterality: Bilateral;    Family History  Problem Relation Age of Onset   Diabetes Mother    ALS Mother    Hypertension Father    Alzheimer's disease Father    Hyperlipidemia Father    Dementia Father    Hypothyroidism Sister    Breast cancer Sister 38       no mass, but did hormone tx   Hyperlipidemia Brother    Hypertension Brother    Diabetes Maternal Aunt    Breast cancer Cousin        second paternal cousin    Social History   Socioeconomic History   Marital status: Married    Spouse name: Reggie   Number of children: 2   Years of education: Not on file   Highest education level: Not on file  Occupational History   Occupation: public house manager  Tobacco Use   Smoking  status: Never   Smokeless tobacco: Never  Vaping Use   Vaping status: Never Used  Substance and Sexual Activity   Alcohol use: Yes   Drug use: No   Sexual activity: Yes    Partners: Male    Birth control/protection: Surgical    Comment: Tubal Ligation  Other Topics Concern   Not on file  Social History Narrative   Not on file   Social Drivers of Health   Financial Resource Strain: Low Risk  (03/02/2022)   Overall Financial Resource Strain (CARDIA)    Difficulty of Paying Living Expenses: Not very hard  Food Insecurity: No Food Insecurity (03/02/2022)   Hunger Vital Sign    Worried About Running Out of Food in the Last Year: Never true    Ran Out of Food in the Last Year: Never true  Transportation Needs: No Transportation Needs (03/02/2022)   PRAPARE - Administrator, Civil Service (Medical): No    Lack of Transportation (Non-Medical): No  Physical Activity: Patient Declined (03/02/2022)   Exercise Vital Sign    Days of Exercise per Week: Patient declined    Minutes of Exercise per Session: Patient declined  Stress: Stress Concern Present (03/02/2022)   [employer] of Occupational Health -  Occupational Stress Questionnaire    Feeling of Stress : To some extent  Social Connections: Moderately Integrated (03/02/2022)   Social Connection and Isolation Panel [NHANES]    Frequency of Communication with Friends and Family: Three times a week    Frequency of Social Gatherings with Friends and Family: Three times a week    Attends Religious Services: 1 to 4 times per year    Active Member of Clubs or Organizations: No    Attends Banker Meetings: Never    Marital Status: Married  Catering Manager Violence: Not At Risk (03/02/2022)   Humiliation, Afraid, Rape, and Kick questionnaire    Fear of Current or Ex-Partner: No    Emotionally Abused: No    Physically Abused: No    Sexually Abused: No    ROS Review of Systems  Constitutional:  Negative for  chills and fever.  Eyes:  Negative for visual disturbance.  Respiratory:  Negative for chest tightness and shortness of breath.   Neurological:  Negative for dizziness and headaches.    Objective:   Today's Vitals: BP (!) 192/146   Pulse (!) 105   Ht 6' 1 (1.854 m)   Wt 246 lb 0.6 oz (111.6 kg)   SpO2 98%   BMI 32.46 kg/m   Physical Exam HENT:     Head: Normocephalic.     Mouth/Throat:     Mouth: Mucous membranes are moist.  Cardiovascular:     Rate and Rhythm: Normal rate.     Heart sounds: Normal heart sounds.  Pulmonary:     Effort: Pulmonary effort is normal.     Breath sounds: Normal breath sounds.  Neurological:     Mental Status: She is alert.      Assessment & Plan:   Hypertension, unspecified type -     EKG 12-Lead -     Telmisartan ; Take 1 tablet (20 mg total) by mouth daily.  Dispense: 30 tablet; Refill: 1 -     amLODIPine  Besylate; Take 1 tablet (5 mg total) by mouth daily.  Dispense: 30 tablet; Refill: 1 -     Ambulatory referral to Cardiology -     UNABLE TO FIND; Med Name: Blood pressure monitor ICD:I10  Dispense: 1 each; Refill: 0  IFG (impaired fasting glucose) -     Hemoglobin A1c  Vitamin D  deficiency -     VITAMIN D  25 Hydroxy (Vit-D Deficiency, Fractures)  Need for hepatitis C screening test -     Hepatitis C antibody  Encounter for screening for HIV -     HIV Antibody (routine testing w rflx)  TSH (thyroid -stimulating hormone deficiency) -     TSH + free T4  Other hyperlipidemia -     Lipid panel -     CMP14+EGFR -     CBC with Differential/Platelet  Sinus tachycardia -     Ambulatory referral to Cardiology  Essential hypertension Assessment & Plan: Uncontrolled BP The patient reports a long-standing history of hypertension; however, she admits that she has not been on pharmacological therapy and has been experiencing increased stress while caring for her children.She reports having a mild headache that was relieved with  Tylenol . She denies chest pain, palpitations, shortness of breath, and visual disturbances.An EKG in the clinic shows sinus tachycardia with left atrial enlargement, and a referral will be placed to cardiology for further evaluation given the patient's chronic uncontrolled blood pressure.Initiating therapy today with Telmisartan  20 mg and Amlodipine  5 mg.A low-sodium diet of  less than 2,300 mg daily is recommended, along with increased physical activity of moderate intensity, aiming for 150 minutes weekly. The patient is encouraged to continue these lifestyle modifications to help manage her blood pressure effectively.The patient is encouraged to report to the ED if her blood pressure exceeds 180/120, especially if accompanied by symptoms such as chest pain, headaches unrelieved by Tylenol  or ibuprofen , palpitations, or shortness of breath.She is advised to monitor her blood pressure once daily and bring her ambulatory readings to her follow-up visit in four weeks. The patient was educated on symptomatic hypertension when starting therapy and encouraged to maintain adequate hydration. BP Readings from Last 3 Encounters:  09/28/23 (!) 192/146  06/09/22 (!) 160/107  06/09/22 120/80       Note: This chart has been completed using [employer] software, and while attempts have been made to ensure accuracy, certain words and phrases may not be transcribed as intended.    Follow-up: Return in about 1 month (around 10/29/2023) for BP.   Varetta Chavers, FNP

## 2023-09-30 NOTE — Assessment & Plan Note (Addendum)
 Uncontrolled BP The patient reports a long-standing history of hypertension; however, she admits that she has not been on pharmacological therapy and has been experiencing increased stress while caring for her children.She reports having a mild headache that was relieved with Tylenol . She denies chest pain, palpitations, shortness of breath, and visual disturbances.An EKG in the clinic shows sinus tachycardia with left atrial enlargement, and a referral will be placed to cardiology for further evaluation given the patient's chronic uncontrolled blood pressure.Initiating therapy today with Telmisartan  20 mg and Amlodipine  5 mg.A low-sodium diet of less than 2,300 mg daily is recommended, along with increased physical activity of moderate intensity, aiming for 150 minutes weekly. The patient is encouraged to continue these lifestyle modifications to help manage her blood pressure effectively.The patient is encouraged to report to the ED if her blood pressure exceeds 180/120, especially if accompanied by symptoms such as chest pain, headaches unrelieved by Tylenol  or ibuprofen , palpitations, or shortness of breath.She is advised to monitor her blood pressure once daily and bring her ambulatory readings to her follow-up visit in four weeks. The patient was educated on symptomatic hypertension when starting therapy and encouraged to maintain adequate hydration. BP Readings from Last 3 Encounters:  09/28/23 (!) 192/146  06/09/22 (!) 160/107  06/09/22 120/80

## 2023-10-26 ENCOUNTER — Other Ambulatory Visit: Payer: Self-pay | Admitting: Obstetrics and Gynecology

## 2023-10-26 DIAGNOSIS — Z1231 Encounter for screening mammogram for malignant neoplasm of breast: Secondary | ICD-10-CM

## 2023-10-28 DIAGNOSIS — R7301 Impaired fasting glucose: Secondary | ICD-10-CM | POA: Diagnosis not present

## 2023-10-28 DIAGNOSIS — E559 Vitamin D deficiency, unspecified: Secondary | ICD-10-CM | POA: Diagnosis not present

## 2023-10-28 DIAGNOSIS — Z114 Encounter for screening for human immunodeficiency virus [HIV]: Secondary | ICD-10-CM | POA: Diagnosis not present

## 2023-10-28 DIAGNOSIS — E7849 Other hyperlipidemia: Secondary | ICD-10-CM | POA: Diagnosis not present

## 2023-10-28 DIAGNOSIS — E038 Other specified hypothyroidism: Secondary | ICD-10-CM | POA: Diagnosis not present

## 2023-10-28 DIAGNOSIS — Z1159 Encounter for screening for other viral diseases: Secondary | ICD-10-CM | POA: Diagnosis not present

## 2023-10-30 LAB — CBC WITH DIFFERENTIAL/PLATELET
Basophils Absolute: 0 10*3/uL (ref 0.0–0.2)
Basos: 0 %
EOS (ABSOLUTE): 0 10*3/uL (ref 0.0–0.4)
Eos: 1 %
Hematocrit: 37.9 % (ref 34.0–46.6)
Hemoglobin: 13.2 g/dL (ref 11.1–15.9)
Immature Grans (Abs): 0 10*3/uL (ref 0.0–0.1)
Immature Granulocytes: 0 %
Lymphocytes Absolute: 1.7 10*3/uL (ref 0.7–3.1)
Lymphs: 29 %
MCH: 30.4 pg (ref 26.6–33.0)
MCHC: 34.8 g/dL (ref 31.5–35.7)
MCV: 87 fL (ref 79–97)
Monocytes Absolute: 0.4 10*3/uL (ref 0.1–0.9)
Monocytes: 6 %
Neutrophils Absolute: 3.7 10*3/uL (ref 1.4–7.0)
Neutrophils: 64 %
Platelets: 294 10*3/uL (ref 150–450)
RBC: 4.34 x10E6/uL (ref 3.77–5.28)
RDW: 14.5 % (ref 11.7–15.4)
WBC: 5.8 10*3/uL (ref 3.4–10.8)

## 2023-10-30 LAB — HIV ANTIBODY (ROUTINE TESTING W REFLEX)

## 2023-10-30 LAB — CMP14+EGFR
ALT: 32 [IU]/L (ref 0–32)
AST: 34 [IU]/L (ref 0–40)
Albumin: 4.5 g/dL (ref 3.9–4.9)
Alkaline Phosphatase: 75 [IU]/L (ref 44–121)
BUN/Creatinine Ratio: 10 (ref 9–23)
BUN: 10 mg/dL (ref 6–24)
Bilirubin Total: 0.6 mg/dL (ref 0.0–1.2)
CO2: 18 mmol/L — ABNORMAL LOW (ref 20–29)
Calcium: 9.3 mg/dL (ref 8.7–10.2)
Chloride: 105 mmol/L (ref 96–106)
Creatinine, Ser: 0.98 mg/dL (ref 0.57–1.00)
Globulin, Total: 2.2 g/dL (ref 1.5–4.5)
Glucose: 84 mg/dL (ref 70–99)
Potassium: 4.3 mmol/L (ref 3.5–5.2)
Sodium: 140 mmol/L (ref 134–144)
Total Protein: 6.7 g/dL (ref 6.0–8.5)
eGFR: 74 mL/min/{1.73_m2} (ref >59)

## 2023-10-30 LAB — HEMOGLOBIN A1C
Est. average glucose Bld gHb Est-mCnc: 105 mg/dL
Hgb A1c MFr Bld: 5.3 % (ref 4.8–5.6)

## 2023-10-30 LAB — TSH+FREE T4
Free T4: 1.06 ng/dL (ref 0.82–1.77)
TSH: 1.51 u[IU]/mL (ref 0.450–4.500)

## 2023-10-30 LAB — VITAMIN D 25 HYDROXY (VIT D DEFICIENCY, FRACTURES): Vit D, 25-Hydroxy: 18.1 ng/mL — ABNORMAL LOW (ref 30.0–100.0)

## 2023-10-30 LAB — LIPID PANEL
Chol/HDL Ratio: 2.9 {ratio} (ref 0.0–4.4)
Cholesterol, Total: 175 mg/dL (ref 100–199)
HDL: 60 mg/dL (ref >39)
LDL Chol Calc (NIH): 100 mg/dL — ABNORMAL HIGH (ref 0–99)
Triglycerides: 79 mg/dL (ref 0–149)
VLDL Cholesterol Cal: 15 mg/dL (ref 5–40)

## 2023-10-30 LAB — HEPATITIS C ANTIBODY: Hep C Virus Ab: NONREACTIVE

## 2023-10-31 ENCOUNTER — Other Ambulatory Visit: Payer: Self-pay | Admitting: Family Medicine

## 2023-10-31 ENCOUNTER — Encounter: Payer: Self-pay | Admitting: Family Medicine

## 2023-10-31 DIAGNOSIS — E559 Vitamin D deficiency, unspecified: Secondary | ICD-10-CM

## 2023-10-31 MED ORDER — VITAMIN D (ERGOCALCIFEROL) 1.25 MG (50000 UNIT) PO CAPS: 50000.0000 [IU] | ORAL_CAPSULE | ORAL | 1 refills | Status: DC

## 2023-10-31 NOTE — Progress Notes (Unsigned)
 The 10-year ASCVD risk score (Arnett DK, et al., 2019) is: 10%   Values used to calculate the score:     Age: 43 years     Sex: Female     Is Non-Hispanic African American: Yes     Diabetic: No     Tobacco smoker: No     Systolic Blood Pressure: 192 mmHg     Is BP treated: Yes     HDL Cholesterol: 60 mg/dL     Total Cholesterol: 175 mg/dL

## 2023-11-01 ENCOUNTER — Ambulatory Visit: Payer: BC Managed Care – PPO | Admitting: Family Medicine

## 2023-11-01 ENCOUNTER — Encounter: Payer: Self-pay | Admitting: Family Medicine

## 2023-11-01 VITALS — BP 151/94 | HR 98 | Ht 73.0 in | Wt 244.1 lb

## 2023-11-01 DIAGNOSIS — E559 Vitamin D deficiency, unspecified: Secondary | ICD-10-CM | POA: Insufficient documentation

## 2023-11-01 DIAGNOSIS — I10 Essential (primary) hypertension: Secondary | ICD-10-CM

## 2023-11-01 MED ORDER — AMLODIPINE BESYLATE 5 MG PO TABS: 5.0000 mg | ORAL_TABLET | Freq: Every day | ORAL | 1 refills | Status: DC

## 2023-11-01 MED ORDER — VITAMIN D (ERGOCALCIFEROL) 1.25 MG (50000 UNIT) PO CAPS: 50000.0000 [IU] | ORAL_CAPSULE | ORAL | 1 refills | Status: DC

## 2023-11-01 MED ORDER — TELMISARTAN 40 MG PO TABS: 40.0000 mg | ORAL_TABLET | Freq: Every day | ORAL | 1 refills | Status: DC

## 2023-11-01 NOTE — Patient Instructions (Addendum)
 I appreciate the opportunity to provide care to you today!    Follow up:  1 months  Labs: please stop by the lab today to get your blood drawn (BMP)  Hypertension Management  Your current blood pressure is above the target goal of <140/90 mmHg. To address this, please start taking Elisa 40 mg daily and amlodipine  5 mg daily    Medication Instructions: Take your blood pressure medication at the same time each day. After taking your medication, check your blood pressure at least an hour later. If your first reading is >140/90 mmHg, wait at least 10 minutes and recheck your blood pressure. Side Effects: In the initial days of therapy, you may experience dizziness or lightheadedness as your body adjusts to the lower blood pressure; this is expected. Diet and Lifestyle: Adhere to a low-sodium diet, limiting intake to less than 1500 mg daily, and increase your physical activity. Avoid over-the-counter NSAIDs such as ibuprofen  and naproxen while on this medication. Hydration and Nutrition: Stay well-hydrated by drinking at least 64 ounces of water  daily. Increase your servings of fruits and vegetables and avoid excessive sodium in your diet. Long-Term Considerations: Uncontrolled hypertension can increase the risk of cardiovascular diseases, including stroke, coronary artery disease, and heart failure.  Please report to the emergency department if your blood pressure exceeds 180/120 and is accompanied by symptoms such as headaches, chest pain, palpitations, blurred vision, or dizziness.    Here are some foods to avoid or reduce in your diet to help manage cholesterol levels:  Fried Foods:Deep-fried items such as french fries, fried chicken, and fried snacks are high in unhealthy fats and can raise LDL (bad) cholesterol levels. Processed Meats:Foods like bacon, sausage, hot dogs, and deli meats are often high in saturated fat and cholesterol. Full-Fat Dairy Products:Whole milk, full-fat yogurt,  butter, cream, and cheese are rich in saturated fats, which can increase cholesterol levels. Baked Goods and Sweets:Pastries, cakes, cookies, and donuts often contain trans fats and added sugars, which can raise LDL cholesterol and lower HDL (good) cholesterol. Red Meat:Beef, lamb, and pork are high in saturated fat. Lean cuts or plant-based protein alternatives are better options. Lard and Shortening:Used in some baked goods, lard and shortening are high in trans fats and should be avoided. Fast Food:Many fast food items are cooked with unhealthy oils and contain high amounts of saturated and trans fats. Processed Snacks:Chips, crackers, and certain microwave popcorns can contain trans fats and high levels of unhealthy oils. Shellfish:While nutritious in other ways, some shellfish like shrimp, lobster, and crab are high in cholesterol. They should be consumed in moderation. Coconut and Palm Oils:these oils are high in saturated fat and can raise cholesterol levels when used in cooking or baking.     Referrals today- Nutritionist   Attached with your AVS, you will find valuable resources for self-education. I highly recommend dedicating some time to thoroughly examine them.   Please continue to a heart-healthy diet and increase your physical activities. Try to exercise for at least five days a week.    It was a pleasure to see you and I look forward to continuing to work together on your health and well-being. Please do not hesitate to call the office if you need care or have questions about your care.  In case of emergency, please visit the Emergency Department for urgent care, or contact our clinic at [phone] to schedule an appointment. We're here to help you!   Have a wonderful day and week.  With Gratitude, Jlynn Langille MSN, FNP-BC

## 2023-11-01 NOTE — Assessment & Plan Note (Signed)
 Encouraged to increase his intake of vitamin D-rich foods such as fatty fish (e.g., salmon, mackerel, and sardines), fortified dairy products, egg yolks, and fortified cereals.

## 2023-11-01 NOTE — Assessment & Plan Note (Addendum)
 Uncontrolled BP The patient reports treatment compliance with Telmisartan  20 mg and Amlodipine  5 mg. She is asymptomatic today in the clinic. Therapy will be initiated with Telmisartan  40 mg, and she is encouraged to continue taking Amlodipine  5 mg daily. The patient is advised to monitor her BP daily and bring her ambulatory readings to her next appointment in four weeks. A low-sodium diet of less than 2300 mg daily is recommended, along with increased physical activity of moderate intensity, aiming for 150 minutes per week. She is encouraged to continue these lifestyle modifications to help manage her blood pressure effectively.  BP Readings from Last 3 Encounters:  11/01/23 (!) 151/94  09/28/23 (!) 192/146  06/09/22 (!) 160/107

## 2023-11-01 NOTE — Progress Notes (Signed)
 Established Patient Office Visit  Subjective:  Patient ID: Erika Figueroa, female    DOB: 11/20/1980  Age: 43 y.o. MRN: [id number]  CC:  Chief Complaint  Patient presents with   Hypertension    1 month follow up.     HPI Erika Figueroa is a 43 y.o. female with past medical history of HTN presents for  BP f/u. For the details of today's visit, please refer to the assessment and plan.     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+   Headache    Hypertension     Past Surgical History:  Procedure Laterality Date   BREAST BIOPSY Right 01/19/2022   positive   BREAST SURGERY     biopsy right   MASTECTOMY Right 02/18/2022   MASTECTOMY W/ SENTINEL NODE BIOPSY Right 02/18/2022   Procedure: MASTECTOMY WITH SENTINEL LYMPH NODE BIOPSY;  Surgeon: Eldred Grego, MD;  Location: [facility] ORS;  Service: General;  Laterality: Right;   TUBAL LIGATION Bilateral 11/13/2017   Procedure: POST PARTUM TUBAL LIGATION;  Surgeon: Kris Pester, MD;  Location: [facility] ORS;  Service: Gynecology;  Laterality: Bilateral;    Family History  Problem Relation Age of Onset   Diabetes Mother    ALS Mother    Hypertension Father    Alzheimer's disease Father    Hyperlipidemia Father    Dementia Father    Hypothyroidism Sister    Breast cancer Sister 81       no mass, but did hormone tx   Hyperlipidemia Brother    Hypertension Brother    Diabetes Maternal Aunt    Breast cancer Cousin        second paternal cousin    Social History   Socioeconomic History   Marital status: Married    Spouse name: Reggie   Number of children: 2   Years of education: Not on file   Highest education level: Not on file  Occupational History   Occupation: Public house manager  Tobacco Use   Smoking status: Never   Smokeless tobacco: Never  Vaping Use   Vaping status: Never Used  Substance and Sexual Activity   Alcohol use: Yes     Alcohol/week: 4.0 - 5.0 standard drinks of alcohol    Types: 4 - 5 Shots of liquor per week    Comment: occ   Drug use: No   Sexual activity: Yes    Partners: Male    Birth control/protection: Surgical    Comment: Tubal Ligation  Other Topics Concern   Not on file  Social History Narrative   Not on file   Social Drivers of Health   Financial Resource Strain: Low Risk  (03/02/2022)   Overall Financial Resource Strain (CARDIA)    Difficulty of Paying Living Expenses: Not very hard  Food Insecurity: No Food Insecurity (03/02/2022)   Hunger Vital Sign    Worried About Running Out of Food in the Last Year: Never true    Ran Out of Food in the Last Year: Never true  Transportation Needs: No Transportation Needs (03/02/2022)   PRAPARE - Administrator, Civil Service (Medical): No    Lack of Transportation (Non-Medical): No  Physical Activity: Patient Declined (03/02/2022)   Exercise Vital Sign    Days of Exercise per Week: Patient declined    Minutes of Exercise per Session: Patient declined  Stress: Stress  Concern Present (03/02/2022)   [employer] of Occupational Health - Occupational Stress Questionnaire    Feeling of Stress : To some extent  Social Connections: Moderately Integrated (03/02/2022)   Social Connection and Isolation Panel [NHANES]    Frequency of Communication with Friends and Family: Three times a week    Frequency of Social Gatherings with Friends and Family: Three times a week    Attends Religious Services: 1 to 4 times per year    Active Member of Clubs or Organizations: No    Attends Banker Meetings: Never    Marital Status: Married  Catering manager Violence: Not At Risk (03/02/2022)   Humiliation, Afraid, Rape, and Kick questionnaire    Fear of Current or Ex-Partner: No    Emotionally Abused: No    Physically Abused: No    Sexually Abused: No    Outpatient Medications Prior to Visit  Medication Sig Dispense Refill   Multiple  Vitamins-Minerals (WOMENS MULTI VITAMIN & MINERAL PO) Take by mouth.     UNABLE TO FIND Med Name: Blood pressure monitor ICD:I10 1 each 0   amLODipine  (NORVASC ) 5 MG tablet Take 1 tablet (5 mg total) by mouth daily. 30 tablet 1   telmisartan  (MICARDIS ) 20 MG tablet Take 1 tablet (20 mg total) by mouth daily. 30 tablet 1   Vitamin D , Ergocalciferol , (DRISDOL ) 1.25 MG (50000 UNIT) CAPS capsule Take 1 capsule (50,000 Units total) by mouth every 7 (seven) days. 20 capsule 1   cholecalciferol (VITAMIN D3) 25 MCG (1000 UNIT) tablet Take 1,000 Units by mouth daily. (Patient not taking: Reported on 11/01/2023)     No facility-administered medications prior to visit.    No Known Allergies  ROS Review of Systems  Constitutional:  Negative for chills and fever.  Eyes:  Negative for visual disturbance.  Respiratory:  Negative for chest tightness and shortness of breath.   Neurological:  Negative for dizziness and headaches.      Objective:    Physical Exam HENT:     Head: Normocephalic.     Mouth/Throat:     Mouth: Mucous membranes are moist.  Cardiovascular:     Rate and Rhythm: Normal rate.     Heart sounds: Normal heart sounds.  Pulmonary:     Effort: Pulmonary effort is normal.     Breath sounds: Normal breath sounds.  Neurological:     Mental Status: She is alert.     BP (!) 151/94   Pulse 98   Ht 6\' 1"  (1.854 m)   Wt 244 lb 1.3 oz (110.7 kg)   LMP 10/13/2023   SpO2 98%   BMI 32.20 kg/m  Wt Readings from Last 3 Encounters:  11/01/23 244 lb 1.3 oz (110.7 kg)  09/28/23 246 lb 0.6 oz (111.6 kg)  06/09/22 235 lb 8 oz (106.8 kg)    Lab Results  Component Value Date   TSH 1.510 10/28/2023   Lab Results  Component Value Date   WBC 5.8 10/28/2023   HGB 13.2 10/28/2023   HCT 37.9 10/28/2023   MCV 87 10/28/2023   PLT 294 10/28/2023   Lab Results  Component Value Date   NA 140 10/28/2023   K 4.3 10/28/2023   CO2 18 (L) 10/28/2023   GLUCOSE 84 10/28/2023   BUN 10  10/28/2023   CREATININE 0.98 10/28/2023   BILITOT 0.6 10/28/2023   ALKPHOS 75 10/28/2023   AST 34 10/28/2023   ALT 32 10/28/2023   PROT 6.7 10/28/2023  ALBUMIN 4.5 10/28/2023   CALCIUM 9.3 10/28/2023   ANIONGAP 6 06/09/2022   EGFR 74 10/28/2023   Lab Results  Component Value Date   CHOL 175 10/28/2023   Lab Results  Component Value Date   HDL 60 10/28/2023   Lab Results  Component Value Date   LDLCALC 100 (H) 10/28/2023   Lab Results  Component Value Date   TRIG 79 10/28/2023   Lab Results  Component Value Date   CHOLHDL 2.9 10/28/2023   Lab Results  Component Value Date   HGBA1C 5.3 10/28/2023      Assessment & Plan:  Essential hypertension Assessment & Plan: Uncontrolled BP The patient reports treatment compliance with Telmisartan  20 mg and Amlodipine  5 mg. She is asymptomatic today in the clinic. Therapy will be initiated with Telmisartan  40 mg, and she is encouraged to continue taking Amlodipine  5 mg daily. The patient is advised to monitor her BP daily and bring her ambulatory readings to her next appointment in four weeks. A low-sodium diet of less than 2300 mg daily is recommended, along with increased physical activity of moderate intensity, aiming for 150 minutes per week. She is encouraged to continue these lifestyle modifications to help manage her blood pressure effectively.  BP Readings from Last 3 Encounters:  11/01/23 (!) 151/94  09/28/23 (!) 192/146  06/09/22 (!) 160/107     Orders: -     amLODIPine  Besylate; Take 1 tablet (5 mg total) by mouth daily.  Dispense: 30 tablet; Refill: 1 -     Telmisartan ; Take 1 tablet (40 mg total) by mouth daily.  Dispense: 30 tablet; Refill: 1 -     Amb ref to Medical Nutrition Therapy-MNT -     BMP8+EGFR  Vitamin D  deficiency Assessment & Plan: Encouraged to increase his intake of vitamin D -rich foods such as fatty fish (e.g., salmon, mackerel, and sardines), fortified dairy products, egg yolks, and  fortified cereals.   Orders: -     Vitamin D  (Ergocalciferol ); Take 1 capsule (50,000 Units total) by mouth every 7 (seven) days.  Dispense: 20 capsule; Refill: 1  Note: This chart has been completed using Engineer, civil (consulting) software, and while attempts have been made to ensure accuracy, certain words and phrases may not be transcribed as intended.    Follow-up: Return in about 1 month (around 11/29/2023).   Jakyia Gaccione, FNP

## 2023-11-02 LAB — BMP8+EGFR
BUN/Creatinine Ratio: 15 (ref 9–23)
BUN: 15 mg/dL (ref 6–24)
CO2: 18 mmol/L — ABNORMAL LOW (ref 20–29)
Calcium: 9.9 mg/dL (ref 8.7–10.2)
Chloride: 104 mmol/L (ref 96–106)
Creatinine, Ser: 1.01 mg/dL — ABNORMAL HIGH (ref 0.57–1.00)
Glucose: 95 mg/dL (ref 70–99)
Potassium: 4.4 mmol/L (ref 3.5–5.2)
Sodium: 139 mmol/L (ref 134–144)
eGFR: 72 mL/min/{1.73_m2} (ref >59)

## 2023-11-11 ENCOUNTER — Other Ambulatory Visit: Payer: Self-pay | Admitting: Family Medicine

## 2023-11-11 DIAGNOSIS — I10 Essential (primary) hypertension: Secondary | ICD-10-CM

## 2023-11-15 ENCOUNTER — Other Ambulatory Visit: Payer: Self-pay

## 2023-11-15 DIAGNOSIS — E559 Vitamin D deficiency, unspecified: Secondary | ICD-10-CM

## 2023-11-15 MED ORDER — VITAMIN D (ERGOCALCIFEROL) 1.25 MG (50000 UNIT) PO CAPS: 50000.0000 [IU] | ORAL_CAPSULE | ORAL | 1 refills | Status: DC

## 2023-11-15 NOTE — Progress Notes (Signed)
 Please inform the patient that her labs are stable

## 2023-12-06 ENCOUNTER — Encounter: Payer: Self-pay | Admitting: Internal Medicine

## 2023-12-06 ENCOUNTER — Ambulatory Visit: Payer: BC Managed Care – PPO | Attending: Internal Medicine | Admitting: Internal Medicine

## 2023-12-06 VITALS — BP 138/90 | HR 92 | Ht 72.0 in | Wt 237.8 lb

## 2023-12-06 DIAGNOSIS — G4733 Obstructive sleep apnea (adult) (pediatric): Secondary | ICD-10-CM | POA: Insufficient documentation

## 2023-12-06 DIAGNOSIS — I10 Essential (primary) hypertension: Secondary | ICD-10-CM | POA: Insufficient documentation

## 2023-12-06 DIAGNOSIS — R009 Unspecified abnormalities of heart beat: Secondary | ICD-10-CM

## 2023-12-06 DIAGNOSIS — R000 Tachycardia, unspecified: Secondary | ICD-10-CM

## 2023-12-06 MED ORDER — AMLODIPINE BESYLATE 5 MG PO TABS: 5.0000 mg | ORAL_TABLET | Freq: Two times a day (BID) | ORAL | 3 refills | Status: DC

## 2023-12-06 NOTE — Patient Instructions (Addendum)
 Medication Instructions:  Your physician has recommended you make the following change in your medication:  Increase Amlodipine 5 mg once daily to twice daily  Continue taking all other medications as prescribed  Labwork: None  Testing/Procedures: Your physician has requested that you have an echocardiogram. Echocardiography is a painless test that uses sound waves to create images of your heart. It provides your doctor with information about the size and shape of your heart and how well your heart's chambers and valves are working. This procedure takes approximately one hour. There are no restrictions for this procedure. Please do NOT wear cologne, perfume, aftershave, or lotions (deodorant is allowed). Please arrive 15 minutes prior to your appointment time.  Please note: We ask at that you not bring children with you during ultrasound (echo/ vascular) testing. Due to room size and safety concerns, children are not allowed in the ultrasound rooms during exams. Our front office staff cannot provide observation of children in our lobby area while testing is being conducted. An adult accompanying a patient to their appointment will only be allowed in the ultrasound room at the discretion of the ultrasound technician under special circumstances. We apologize for any inconvenience.   Follow-Up: Your physician recommends that you schedule a follow-up appointment in: 3 months  Any Other Special Instructions Will Be Listed Below (If Applicable). Referral to Sleep Medicine  Thank you for choosing [facility]!     If you need a refill on your cardiac medications before your next appointment, please call your pharmacy.

## 2023-12-06 NOTE — Progress Notes (Signed)
 Cardiology Office Note  Date: 12/06/2023   ID: Erika Figueroa, DOB 1981/01/31, MRN [id number]  PCP:  Gilmore Laroche, FNP  Cardiologist:  Marjo Bicker, MD Electrophysiologist:  None   History of Present Illness: Erika Figueroa is a 43 y.o. female known to have HTN was referred to cardiology clinic for evaluation of HTN and elevated heart rate.  Home BPs range around 138 mmHg SBP.  She does not have any symptoms of angina, DOE, dizziness, presyncope, syncope, palpitations or leg swelling.  She is currently taking amlodipine 5 mg once daily and telmisartan 40 mg once daily.  She does have symptoms of snoring, fatigue etc. but she was never tested for OSA.  Denies smoking cigarettes.  Occasional alcohol use which she is trying to cut down.  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+   Headache    Hypertension     Past Surgical History:  Procedure Laterality Date   BREAST BIOPSY Right 01/19/2022   positive   BREAST SURGERY     biopsy right   MASTECTOMY Right 02/18/2022   MASTECTOMY W/ SENTINEL NODE BIOPSY Right 02/18/2022   Procedure: MASTECTOMY WITH SENTINEL LYMPH NODE BIOPSY;  Surgeon: Carolan Shiver, MD;  Location: [facility] OR[facility];  Service: General;  Laterality: Right;   TUBAL LIGATION Bilateral 11/13/2017   Procedure: POST PARTUM TUBAL LIGATION;  Surgeon: Conard Novak, MD;  Location: [facility] ORS;  Service: Gynecology;  Laterality: Bilateral;    Current Outpatient Medications  Medication Sig Dispense Refill   Multiple Vitamins-Minerals (WOMENS MULTI VITAMIN & MINERAL PO) Take by mouth daily.     telmisartan (MICARDIS) 40 MG tablet Take 1 tablet (40 mg total) by mouth daily. 30 tablet 1   Vitamin D, Ergocalciferol, (DRISDOL) 1.25 MG (50000 UNIT) CAPS capsule Take 1 capsule (50,000 Units total) by mouth every 7 (seven) days. 20 capsule 1   amLODipine (NORVASC) 5 MG tablet Take 1 tablet (5  mg total) by mouth 2 (two) times daily. 60 tablet 3   No current facility-administered medications for this visit.   Allergies:  Patient has no known allergies.   Social History: The patient  reports that she has never smoked. She has never used smokeless tobacco. She reports current alcohol use of about 4.0 - 5.0 standard drinks of alcohol per week. She reports that she does not use drugs.   Family History: The patient's family history includes ALS in her mother; Alzheimer's disease in her father; Breast cancer in her cousin; Breast cancer (age of onset: 35) in her sister; Dementia in her father; Diabetes in her maternal aunt and mother; Hyperlipidemia in her brother and father; Hypertension in her brother and father; Hypothyroidism in her sister.   ROS:  Please see the history of present illness. Otherwise, complete review of systems is positive for none  All other systems are reviewed and negative.   Physical Exam: VS:  BP (!) 138/90   Pulse 92   Ht 6' (1.829 m)   Wt 237 lb 12.8 oz (107.9 kg)   SpO2 98%   BMI 32.25 kg/m , BMI Body mass index is 32.25 kg/m.  Wt Readings from Last 3 Encounters:  12/06/23 237 lb 12.8 oz (107.9 kg)  11/01/23 244 lb 1.3 oz (110.7 kg)  09/28/23 246 lb 0.6 oz (111.6 kg)    General: Patient appears comfortable at rest. HEENT: Conjunctiva and lids normal,  oropharynx clear with moist mucosa. Neck: Supple, no elevated JVP or carotid bruits, no thyromegaly. Lungs: Clear to auscultation, nonlabored breathing at rest. Cardiac: Regular rate and rhythm, no S3 or significant systolic murmur, no pericardial rub. Abdomen: Soft, nontender, no hepatomegaly, bowel sounds present, no guarding or rebound. Extremities: No pitting edema, distal pulses 2+. Skin: Warm and dry. Musculoskeletal: No kyphosis. Neuropsychiatric: Alert and oriented x3, affect grossly appropriate.  Recent Labwork: 10/28/2023: ALT 32; AST 34; Hemoglobin 13.2; Platelets 294; TSH 1.510 11/01/2023:  BUN 15; Creatinine, Ser 1.01; Potassium 4.4; Sodium 139     Component Value Date/Time   CHOL 175 10/28/2023 0839   TRIG 79 10/28/2023 0839   HDL 60 10/28/2023 0839   CHOLHDL 2.9 10/28/2023 0839   CHOLHDL 2.4 Ratio 12/12/2009 2010   VLDL 14 12/12/2009 2010   LDLCALC 100 (H) 10/28/2023 0839     Assessment and Plan:   HTN, poorly controlled: Home BPs more than 130 mmHg SBP.  Increase amlodipine from 5 mg once daily to twice daily and continue telmisartan 40 mg once daily.  Will refer to sleep medicine for OSA evaluation as she does have symptoms of snoring, fatigue etc.  Obtain echocardiogram.  OSA screening: Refer to sleep medicine for OSA evaluation.  Elevated heart rate/sinus tachycardia, rule out IST: Strongly encouraged patient to start exercising.  If she continues to have elevated heart rates, will need event monitor to confirm IST and rule out malignant arrhythmias.  Will wait until her OSA evaluation is completed.      Medication Adjustments/Labs and Tests Ordered: Current medicines are reviewed at length with the patient today.  Concerns regarding medicines are outlined above.    Disposition:  Follow up  3 months  Signed Adayah Arocho Verne Spurr, MD, 12/06/2023 3:56 PM    [facility] at [facility] [address] [place], [place], [place] [address]

## 2023-12-10 ENCOUNTER — Encounter: Payer: Self-pay | Admitting: Internal Medicine

## 2023-12-14 ENCOUNTER — Encounter: Payer: Self-pay | Admitting: Internal Medicine

## 2023-12-15 ENCOUNTER — Other Ambulatory Visit: Payer: Self-pay | Admitting: Family Medicine

## 2023-12-15 DIAGNOSIS — I10 Essential (primary) hypertension: Secondary | ICD-10-CM

## 2023-12-16 ENCOUNTER — Ambulatory Visit: Payer: BC Managed Care – PPO | Admitting: Family Medicine

## 2023-12-27 ENCOUNTER — Other Ambulatory Visit

## 2023-12-30 ENCOUNTER — Ambulatory Visit (HOSPITAL_COMMUNITY)
Admission: RE | Admit: 2023-12-30 | Discharge: 2023-12-30 | Disposition: A | Discharge status: Home / Self Care | Source: Ambulatory Visit | Attending: Internal Medicine | Admitting: Internal Medicine

## 2023-12-30 ENCOUNTER — Ambulatory Visit: Admitting: Nutrition

## 2023-12-30 DIAGNOSIS — I10 Essential (primary) hypertension: Secondary | ICD-10-CM | POA: Insufficient documentation

## 2023-12-30 DIAGNOSIS — R000 Tachycardia, unspecified: Secondary | ICD-10-CM | POA: Insufficient documentation

## 2023-12-30 LAB — ECHOCARDIOGRAM COMPLETE
AR max vel: 3.24 cm2
AV Area VTI: 3.33 cm2
AV Area mean vel: 2.89 cm2
AV Mean grad: 4 mmHg
AV Peak grad: 5.4 mmHg
Ao pk vel: 1.16 m/s
Area-P 1/2: 2.6 cm2
S' Lateral: 2.9 cm

## 2024-01-10 ENCOUNTER — Ambulatory Visit
Admission: RE | Admit: 2024-01-10 | Discharge: 2024-01-10 | Disposition: A | Discharge status: Home / Self Care | Payer: BC Managed Care – PPO | Source: Ambulatory Visit | Attending: Obstetrics and Gynecology | Admitting: Obstetrics and Gynecology

## 2024-01-10 DIAGNOSIS — Z1231 Encounter for screening mammogram for malignant neoplasm of breast: Secondary | ICD-10-CM | POA: Insufficient documentation

## 2024-01-13 ENCOUNTER — Encounter: Payer: Self-pay | Admitting: Obstetrics and Gynecology

## 2024-01-20 ENCOUNTER — Encounter: Payer: Self-pay | Admitting: Family Medicine

## 2024-01-20 NOTE — Progress Notes (Deleted)
 PCP:  Erika Figueroa, Gloria, FNP   No chief complaint on file.  CMP due with ONC  HPI:      [name] is a 43 y.o. [id number] whose LMP was No LMP recorded., presents today for her annual examination.  Her menses are regular every 28-30 days, lasting 7 days.  Dysmenorrhea none. She does not have intermenstrual bleeding. Having occas night sweats with tamoxifen .  Sex activity: single partner, contraception - tubal ligation. No pain/bleeding. Last Pap: 03/21/20 Results were: no abnormalities /neg HPV DNA   Last mammogram: 01/10/24  Results were: normal LT breast, repeat in 12 months; cat 5 RT breast 4/23; pt noticed breast mass on SBE. Diagnosed with RT breast DCIS/ER+ 5/23; s/p mastectomy, on tamoxifen . Followed by oncology.  Ambry BRCA+ testing neg. There is a FH of breast cancer in her sister and pat 2nd cousin. There is no FH of ovarian cancer. The patient does not do self-breast exams.   Tobacco use: The patient denies current or previous tobacco use. Alcohol use: none No drug use.  Exercise: moderately active  She does get adequate calcium and Vitamin D  in her diet. 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   Vaginitis and vulvovaginitis 12/26/2009   Overweight 12/04/2009   Essential hypertension 12/04/2009   Allergic rhinitis 12/04/2009   FATIGUE 12/04/2009    Past Surgical History:  Procedure Laterality Date   BREAST BIOPSY Right 01/19/2022   positive   BREAST SURGERY     biopsy right   MASTECTOMY Right 02/18/2022    MASTECTOMY W/ SENTINEL NODE BIOPSY Right 02/18/2022   Procedure: MASTECTOMY WITH SENTINEL LYMPH NODE BIOPSY;  Surgeon: Eldred Grego, MD;  Location: [facility] ORS;  Service: General;  Laterality: Right;   TUBAL LIGATION Bilateral 11/13/2017   Procedure: POST PARTUM TUBAL LIGATION;  Surgeon: Kris Pester, MD;  Location: [facility] ORS;  Service: Gynecology;  Laterality: Bilateral;    Family History  Problem Relation Age of Onset   Diabetes Mother    ALS Mother    Hypertension Father    Alzheimer's disease Father    Hyperlipidemia Father    Dementia Father    Hypothyroidism Sister    Breast cancer Sister 71       no mass, but did hormone tx   Hyperlipidemia Brother    Hypertension Brother    Diabetes Maternal Aunt    Breast cancer Cousin        second paternal cousin    Social History   Socioeconomic History   Marital status: Married    Spouse name: Erika Figueroa   Number of children: 2   Years of education: Not on file   Highest education level: Not on file  Occupational History   Occupation: Public house manager  Tobacco Use   Smoking status: Never   Smokeless tobacco: Never  Vaping Use   Vaping status: Never Used  Substance and Sexual Activity   Alcohol use: Yes    Alcohol/week: 4.0 - 5.0 standard drinks of alcohol    Types: 4 - 5  Shots of liquor per week    Comment: occ   Drug use: No   Sexual activity: Yes    Partners: Male    Birth control/protection: Surgical    Comment: Tubal Ligation  Other Topics Concern   Not on file  Social History Narrative   Not on file   Social Drivers of Health   Financial Resource Strain: Low Risk  (03/02/2022)   Overall Financial Resource Strain (CARDIA)    Difficulty of Paying Living Expenses: Not very hard  Food Insecurity: No Food Insecurity (03/02/2022)   Hunger Vital Sign    Worried About Running Out of Food in the Last Year: Never true    Ran Out of Food in the Last Year: Never true  Transportation Needs: No Transportation  Needs (03/02/2022)   PRAPARE - Administrator, Civil Service (Medical): No    Lack of Transportation (Non-Medical): No  Physical Activity: Patient Declined (03/02/2022)   Exercise Vital Sign    Days of Exercise per Week: Patient declined    Minutes of Exercise per Session: Patient declined  Stress: Stress Concern Present (03/02/2022)   [employer] of Occupational Health - Occupational Stress Questionnaire    Feeling of Stress : To some extent  Social Connections: Moderately Integrated (03/02/2022)   Social Connection and Isolation Panel [NHANES]    Frequency of Communication with Friends and Family: Three times a week    Frequency of Social Gatherings with Friends and Family: Three times a week    Attends Religious Services: 1 to 4 times per year    Active Member of Clubs or Organizations: No    Attends Banker Meetings: Never    Marital Status: Married  Catering manager Violence: Not At Risk (03/02/2022)   Humiliation, Afraid, Rape, and Kick questionnaire    Fear of Current or Ex-Partner: No    Emotionally Abused: No    Physically Abused: No    Sexually Abused: No     Current Outpatient Medications:    amLODipine  (NORVASC ) 5 MG tablet, Take 1 tablet (5 mg total) by mouth 2 (two) times daily., Disp: 60 tablet, Rfl: 3   Multiple Vitamins-Minerals (WOMENS MULTI VITAMIN & MINERAL PO), Take by mouth daily., Disp: , Rfl:    telmisartan  (MICARDIS ) 40 MG tablet, TAKE ONE TABLET BY MOUTH ONCE DAILY, Disp: 30 tablet, Rfl: 1   Vitamin D , Ergocalciferol , (DRISDOL ) 1.25 MG (50000 UNIT) CAPS capsule, Take 1 capsule (50,000 Units total) by mouth every 7 (seven) days., Disp: 20 capsule, Rfl: 1     ROS:  Review of Systems  Constitutional:  Negative for fatigue, fever and unexpected weight change.  Respiratory:  Negative for cough, shortness of breath and wheezing.   Cardiovascular:  Negative for chest pain, palpitations and leg swelling.  Gastrointestinal:   Negative for blood in stool, constipation, diarrhea, nausea and vomiting.  Endocrine: Negative for cold intolerance, heat intolerance and polyuria.  Genitourinary:  Negative for dyspareunia, dysuria, flank pain, frequency, genital sores, hematuria, menstrual problem, pelvic pain, urgency, vaginal bleeding, vaginal discharge and vaginal pain.  Musculoskeletal:  Negative for back pain, joint swelling and myalgias.  Skin:  Negative for rash.  Neurological:  Negative for dizziness, syncope, light-headedness, numbness and headaches.  Hematological:  Negative for adenopathy.  Psychiatric/Behavioral:  Negative for agitation, confusion, sleep disturbance and suicidal ideas. The patient is not nervous/anxious.    BREAST: No symptoms   Objective: There were no vitals taken for this visit.   Physical Exam  Constitutional:      Appearance: She is well-developed.  Genitourinary:     Vulva normal.     Genitourinary Comments: RT BREAST ABSENT     Right Labia: No rash, tenderness or lesions.    Left Labia: No tenderness, lesions or rash.    No vaginal discharge, erythema or tenderness.      Right Adnexa: not tender and no mass present.    Left Adnexa: not tender and no mass present.    No cervical friability or polyp.     Uterus is not enlarged or tender.  Breasts:    Right: Absent. No mass, nipple discharge, skin change or tenderness.     Left: No mass, nipple discharge, skin change or tenderness.  Neck:     Thyroid : No thyromegaly.  Cardiovascular:     Rate and Rhythm: Normal rate and regular rhythm.     Heart sounds: Normal heart sounds. No murmur heard. Pulmonary:     Effort: Pulmonary effort is normal.     Breath sounds: Normal breath sounds.  Abdominal:     Palpations: Abdomen is soft.     Tenderness: There is no abdominal tenderness. There is no guarding or rebound.  Musculoskeletal:        General: Normal range of motion.     Cervical back: Normal range of motion.   Lymphadenopathy:     Cervical: No cervical adenopathy.  Neurological:     General: No focal deficit present.     Mental Status: She is alert and oriented to person, place, and time.     Cranial Nerves: No cranial nerve deficit.  Skin:    General: Skin is warm and dry.  Psychiatric:        Mood and Affect: Mood normal.        Behavior: Behavior normal.        Thought Content: Thought content normal.        Judgment: Judgment normal.  Vitals reviewed.     Assessment/Plan: Encounter for annual routine gynecological examination  Encounter for screening mammogram for malignant neoplasm of breast; pt due for LT breast mammo 4/24, ordered through oncology.  Ductal carcinoma in situ (DCIS) of right breast--followed by DR. Randy Buttery  History of tamoxifen  therapy--f/u prn [facility]  Blood tests for routine general physical examination--will do fasting labs next yr.             GYN counsel breast self exam, mammography screening, adequate intake of calcium and vitamin D , diet and exercise     F/U  No follow-ups on file.  Jammie Clink B. Jakeia Carreras, PA-C 01/20/2024 3:53 PM

## 2024-01-21 NOTE — Telephone Encounter (Signed)
 I recommend daily elevation of the legs for 20-30 minutes above the level of the heart. Additionally, daily use of compression stockings is advised, along with regular exercise and weight reduction. It's also important to refrain from prolonged sitting or standing to alleviate symptoms.

## 2024-01-24 ENCOUNTER — Ambulatory Visit: Admitting: Nutrition

## 2024-01-25 ENCOUNTER — Ambulatory Visit: Admitting: Obstetrics and Gynecology

## 2024-01-25 DIAGNOSIS — Z1231 Encounter for screening mammogram for malignant neoplasm of breast: Secondary | ICD-10-CM

## 2024-01-25 DIAGNOSIS — Z01419 Encounter for gynecological examination (general) (routine) without abnormal findings: Secondary | ICD-10-CM

## 2024-01-25 DIAGNOSIS — Z9229 Personal history of other drug therapy: Secondary | ICD-10-CM

## 2024-01-25 DIAGNOSIS — Z124 Encounter for screening for malignant neoplasm of cervix: Secondary | ICD-10-CM

## 2024-01-25 DIAGNOSIS — D0511 Intraductal carcinoma in situ of right breast: Secondary | ICD-10-CM

## 2024-01-25 DIAGNOSIS — Z1151 Encounter for screening for human papillomavirus (HPV): Secondary | ICD-10-CM

## 2024-02-02 ENCOUNTER — Ambulatory Visit: Admitting: Primary Care

## 2024-02-10 ENCOUNTER — Ambulatory Visit: Admitting: Obstetrics and Gynecology

## 2024-02-21 DIAGNOSIS — M25774 Osteophyte, right foot: Secondary | ICD-10-CM | POA: Diagnosis not present

## 2024-02-21 DIAGNOSIS — M79671 Pain in right foot: Secondary | ICD-10-CM | POA: Diagnosis not present

## 2024-02-21 DIAGNOSIS — L600 Ingrowing nail: Secondary | ICD-10-CM | POA: Diagnosis not present

## 2024-02-21 DIAGNOSIS — L03031 Cellulitis of right toe: Secondary | ICD-10-CM | POA: Diagnosis not present

## 2024-02-28 ENCOUNTER — Ambulatory Visit: Admitting: Family Medicine

## 2024-02-28 ENCOUNTER — Encounter: Payer: Self-pay | Admitting: Family Medicine

## 2024-02-28 VITALS — BP 136/91 | HR 97 | Ht 72.0 in | Wt 217.0 lb

## 2024-02-28 DIAGNOSIS — M25471 Effusion, right ankle: Secondary | ICD-10-CM

## 2024-02-28 DIAGNOSIS — I10 Essential (primary) hypertension: Secondary | ICD-10-CM

## 2024-02-28 DIAGNOSIS — M25472 Effusion, left ankle: Secondary | ICD-10-CM | POA: Diagnosis not present

## 2024-02-28 MED ORDER — TELMISARTAN 40 MG PO TABS: 40.0000 mg | ORAL_TABLET | Freq: Every day | ORAL | 1 refills | Status: DC

## 2024-02-28 MED ORDER — HYDROCHLOROTHIAZIDE 12.5 MG PO TABS: 12.5000 mg | ORAL_TABLET | Freq: Every day | ORAL | 0 refills | Status: DC | PRN

## 2024-02-28 NOTE — Assessment & Plan Note (Signed)
 Uncontrolled Blood Pressure in Clinic: The patient reports adherence to losartan 40 mg daily and amlodipine  5 mg twice daily. She notes home blood pressure readings are generally below 140/90 and is currently asymptomatic in the clinic.  No changes made to her current treatment regimen. She is encouraged to continue with her prescribed medications, monitor her blood pressure at home, and notify the clinic if readings consistently exceed 140/90.  The patient verbalized understanding of the plan of care. Lifestyle modifications were reinforced, including a low-sodium diet and increased physical activity.

## 2024-02-28 NOTE — Patient Instructions (Addendum)
 I appreciate the opportunity to provide care to you today!    Follow up: 4 months  Labs: please stop by the lab today to get your blood drawn (BMP)  Ankle Swelling: You may take hydrochlorothiazide 12.5 mg daily as needed to help reduce swelling. I also recommend elevating your legs daily for 20-30 minutes above the level of your heart. In addition, daily use of compression stockings is advised, along with regular physical activity and weight reduction. Please avoid prolonged sitting or standing to help alleviate symptoms.  Hypertension: Continue your current treatment regimen as your home blood pressure readings are within the target range. Please notify me if your blood pressure readings consistently exceed 140/90 mmHg.  Please follow up if your symptoms worsen or fail to improve.  Please continue to a heart-healthy diet and increase your physical activities. Try to exercise for at least five days a week.    It was a pleasure to see you and I look forward to continuing to work together on your health and well-being. Please do not hesitate to call the office if you need care or have questions about your care.  In case of emergency, please visit the Emergency Department for urgent care, or contact our clinic at [phone] to schedule an appointment. We're here to help you!   Have a wonderful day and week. With Gratitude, Dhruvi Crenshaw MSN, FNP-BC

## 2024-02-28 NOTE — Progress Notes (Signed)
 Established Patient Office Visit  Subjective:  Patient ID: Erika Figueroa, female    DOB: [birth year]  Age: 43 y.o. MRN: [id number]  CC:  Chief Complaint  Patient presents with   Hypertension    Follow up , ankles swelling    HPI Erika Figueroa is a 43 y.o. female presents for BP f/u.  For the details of today's visit, please refer to the assessment and plan.     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+   Headache    Hypertension     Past Surgical History:  Procedure Laterality Date   BREAST BIOPSY Right 01/19/2022   positive   BREAST SURGERY     biopsy right   MASTECTOMY Right 02/18/2022   MASTECTOMY W/ SENTINEL NODE BIOPSY Right 02/18/2022   Procedure: MASTECTOMY WITH SENTINEL LYMPH NODE BIOPSY;  Surgeon: Eldred Grego, MD;  Location: [facility];  Service: General;  Laterality: Right;   TUBAL LIGATION Bilateral 11/13/2017   Procedure: POST PARTUM TUBAL LIGATION;  Surgeon: Kris Pester, MD;  Location: [facility];  Service: Gynecology;  Laterality: Bilateral;    Family History  Problem Relation Age of Onset   Diabetes Mother    ALS Mother    Hypertension Father    Alzheimer's disease Father    Hyperlipidemia Father    Dementia Father    Hypothyroidism Sister    Breast cancer Sister 81       no mass, but did hormone tx   Hyperlipidemia Brother    Hypertension Brother    Diabetes Maternal Aunt    Breast cancer Cousin        second paternal cousin    Social History   Socioeconomic History   Marital status: Married    Spouse name: Reggie   Number of children: 2   Years of education: Not on file   Highest education level: Not on file  Occupational History   Occupation: Public house manager  Tobacco Use   Smoking status: Never   Smokeless tobacco: Never  Vaping Use   Vaping status: Never Used  Substance and Sexual Activity   Alcohol use: Yes    Alcohol/week: 4.0 - 5.0  standard drinks of alcohol    Types: 4 - 5 Shots of liquor per week    Comment: occ   Drug use: No   Sexual activity: Yes    Partners: Male    Birth control/protection: Surgical    Comment: Tubal Ligation  Other Topics Concern   Not on file  Social History Narrative   Not on file   Social Drivers of Health   Financial Resource Strain: Low Risk  (03/02/2022)   Overall Financial Resource Strain (CARDIA)    Difficulty of Paying Living Expenses: Not very hard  Food Insecurity: No Food Insecurity (03/02/2022)   Hunger Vital Sign    Worried About Running Out of Food in the Last Year: Never true    Ran Out of Food in the Last Year: Never true  Transportation Needs: No Transportation Needs (03/02/2022)   PRAPARE - Administrator, Civil Service (Medical): No    Lack of Transportation (Non-Medical): No  Physical Activity: Patient Declined (03/02/2022)   Exercise Vital Sign    Days of Exercise per Week: Patient declined    Minutes of Exercise per Session: Patient declined  Stress: Stress Concern Present (03/02/2022)   [place]  [employer] - Occupational Stress Questionnaire    Feeling of Stress : To some extent  Social Connections: Moderately Integrated (03/02/2022)   Social Connection and Isolation Panel [NHANES]    Frequency of Communication with Friends and Family: Three times a week    Frequency of Social Gatherings with Friends and Family: Three times a week    Attends Religious Services: 1 to 4 times per year    Active Member of Clubs or Organizations: No    Attends Banker Meetings: Never    Marital Status: Married  Catering manager Violence: Not At Risk (03/02/2022)   Humiliation, Afraid, Rape, and Kick questionnaire    Fear of Current or Ex-Partner: No    Emotionally Abused: No    Physically Abused: No    Sexually Abused: No    Outpatient Medications Prior to Visit  Medication Sig Dispense Refill   amLODipine  (NORVASC ) 5 MG tablet  Take 1 tablet (5 mg total) by mouth 2 (two) times daily. 60 tablet 3   Multiple Vitamins-Minerals (WOMENS MULTI VITAMIN & MINERAL PO) Take by mouth daily.     Vitamin D , Ergocalciferol , (DRISDOL ) 1.25 MG (50000 UNIT) CAPS capsule Take 1 capsule (50,000 Units total) by mouth every 7 (seven) days. 20 capsule 1   telmisartan  (MICARDIS ) 40 MG tablet TAKE ONE TABLET BY MOUTH ONCE DAILY 30 tablet 1   No facility-administered medications prior to visit.    No Known Allergies  ROS Review of Systems  Constitutional:  Negative for chills and fever.  Eyes:  Negative for visual disturbance.  Respiratory:  Negative for chest tightness and shortness of breath.   Neurological:  Negative for dizziness and headaches.      Objective:     Physical Exam HENT:     Head: Normocephalic.     Mouth/Throat:     Mouth: Mucous membranes are moist.  Cardiovascular:     Rate and Rhythm: Normal rate.     Heart sounds: Normal heart sounds.  Pulmonary:     Effort: Pulmonary effort is normal.     Breath sounds: Normal breath sounds.  Neurological:     Mental Status: She is alert.     BP (!) 136/91   Pulse 97   Ht 6' (1.829 m)   Wt 217 lb (98.4 kg)   SpO2 98%   BMI 29.43 kg/m  Wt Readings from Last 3 Encounters:  02/28/24 217 lb (98.4 kg)  12/06/23 237 lb 12.8 oz (107.9 kg)  11/01/23 244 lb 1.3 oz (110.7 kg)    Lab Results  Component Value Date   TSH 1.510 10/28/2023   Lab Results  Component Value Date   WBC 5.8 10/28/2023   HGB 13.2 10/28/2023   HCT 37.9 10/28/2023   MCV 87 10/28/2023   PLT 294 10/28/2023   Lab Results  Component Value Date   NA 139 11/01/2023   K 4.4 11/01/2023   CO2 18 (L) 11/01/2023   GLUCOSE 95 11/01/2023   BUN 15 11/01/2023   CREATININE 1.01 (H) 11/01/2023   BILITOT 0.6 10/28/2023   ALKPHOS 75 10/28/2023   AST 34 10/28/2023   ALT 32 10/28/2023   PROT 6.7 10/28/2023   ALBUMIN 4.5 10/28/2023   CALCIUM 9.9 11/01/2023   ANIONGAP 6 06/09/2022   EGFR 72  11/01/2023   Lab Results  Component Value Date   CHOL 175 10/28/2023   Lab Results  Component Value Date   HDL 60 10/28/2023   Lab Results  Component Value Date   LDLCALC 100 (H) 10/28/2023   Lab Results  Component Value Date   TRIG 79 10/28/2023   Lab Results  Component Value Date   CHOLHDL 2.9 10/28/2023   Lab Results  Component Value Date   HGBA1C 5.3 10/28/2023      Assessment & Plan:  Essential hypertension Assessment & Plan: Uncontrolled Blood Pressure in Clinic: The patient reports adherence to losartan 40 mg daily and amlodipine  5 mg twice daily. She notes home blood pressure readings are generally below 140/90 and is currently asymptomatic in the clinic.  No changes made to her current treatment regimen. She is encouraged to continue with her prescribed medications, monitor her blood pressure at home, and notify the clinic if readings consistently exceed 140/90.  The patient verbalized understanding of the plan of care. Lifestyle modifications were reinforced, including a low-sodium diet and increased physical activity.   Orders: -     BMP8+EGFR -     hydroCHLOROthiazide; Take 1 tablet (12.5 mg total) by mouth daily as needed.  Dispense: 30 tablet; Refill: 0 -     Telmisartan ; Take 1 tablet (40 mg total) by mouth daily.  Dispense: 90 tablet; Refill: 1  Swelling of both ankles Assessment & Plan: No visible swelling of the ankle noted today on physical examination Encouraged to take hydrochlorothiazide 12.5 mg daily as needed to help reduce swelling. I also recommend elevating her legs daily for 20-30 minutes above the level of your heart. In addition, daily use of compression stockings is advised, along with regular physical activity and weight reduction.Encouraged to avoid prolonged sitting or standing to help alleviate symptoms.   Note: This chart has been completed using Engineer, civil (consulting) software, and while attempts have been made to ensure  accuracy, certain words and phrases may not be transcribed as intended.    Follow-up: Return in about 4 months (around 06/29/2024).   Ashiya Kinkead, FNP

## 2024-02-28 NOTE — Assessment & Plan Note (Signed)
 No visible swelling of the ankle noted today on physical examination Encouraged to take hydrochlorothiazide 12.5 mg daily as needed to help reduce swelling. I also recommend elevating her legs daily for 20-30 minutes above the level of your heart. In addition, daily use of compression stockings is advised, along with regular physical activity and weight reduction.Encouraged to avoid prolonged sitting or standing to help alleviate symptoms.

## 2024-02-29 LAB — BMP8+EGFR
BUN/Creatinine Ratio: 16 (ref 9–23)
BUN: 14 mg/dL (ref 6–24)
CO2: 18 mmol/L — ABNORMAL LOW (ref 20–29)
Calcium: 9.3 mg/dL (ref 8.7–10.2)
Chloride: 103 mmol/L (ref 96–106)
Creatinine, Ser: 0.85 mg/dL (ref 0.57–1.00)
Glucose: 85 mg/dL (ref 70–99)
Potassium: 4.5 mmol/L (ref 3.5–5.2)
Sodium: 139 mmol/L (ref 134–144)
eGFR: 88 mL/min/{1.73_m2} (ref >59)

## 2024-03-06 DIAGNOSIS — M79674 Pain in right toe(s): Secondary | ICD-10-CM | POA: Diagnosis not present

## 2024-03-06 DIAGNOSIS — L03031 Cellulitis of right toe: Secondary | ICD-10-CM | POA: Diagnosis not present

## 2024-03-06 DIAGNOSIS — L600 Ingrowing nail: Secondary | ICD-10-CM | POA: Diagnosis not present

## 2024-03-06 DIAGNOSIS — M79671 Pain in right foot: Secondary | ICD-10-CM | POA: Diagnosis not present

## 2024-03-07 DIAGNOSIS — H5213 Myopia, bilateral: Secondary | ICD-10-CM | POA: Diagnosis not present

## 2024-03-13 ENCOUNTER — Ambulatory Visit: Attending: Internal Medicine | Admitting: Internal Medicine

## 2024-03-13 ENCOUNTER — Encounter: Payer: Self-pay | Admitting: Primary Care

## 2024-03-13 ENCOUNTER — Ambulatory Visit: Admitting: Primary Care

## 2024-03-13 NOTE — Progress Notes (Signed)
 Erroneous encounter - please disregard.

## 2024-03-15 ENCOUNTER — Encounter: Payer: Self-pay | Admitting: Internal Medicine

## 2024-03-24 ENCOUNTER — Ambulatory Visit: Payer: Self-pay | Admitting: Family Medicine

## 2024-03-27 DIAGNOSIS — M79674 Pain in right toe(s): Secondary | ICD-10-CM | POA: Diagnosis not present

## 2024-03-27 DIAGNOSIS — L03031 Cellulitis of right toe: Secondary | ICD-10-CM | POA: Diagnosis not present

## 2024-03-27 DIAGNOSIS — L600 Ingrowing nail: Secondary | ICD-10-CM | POA: Diagnosis not present

## 2024-03-27 DIAGNOSIS — M79671 Pain in right foot: Secondary | ICD-10-CM | POA: Diagnosis not present

## 2024-04-26 ENCOUNTER — Other Ambulatory Visit: Payer: Self-pay | Admitting: Internal Medicine

## 2024-04-26 DIAGNOSIS — I10 Essential (primary) hypertension: Secondary | ICD-10-CM

## 2024-06-19 ENCOUNTER — Other Ambulatory Visit: Payer: Self-pay | Admitting: Family Medicine

## 2024-06-19 DIAGNOSIS — E559 Vitamin D deficiency, unspecified: Secondary | ICD-10-CM

## 2024-07-10 ENCOUNTER — Encounter: Payer: Self-pay | Admitting: Family Medicine

## 2024-07-10 ENCOUNTER — Ambulatory Visit: Admitting: Family Medicine

## 2024-07-10 VITALS — BP 140/90 | HR 85 | Resp 16 | Ht 72.0 in | Wt 227.0 lb

## 2024-07-10 DIAGNOSIS — E559 Vitamin D deficiency, unspecified: Secondary | ICD-10-CM | POA: Diagnosis not present

## 2024-07-10 DIAGNOSIS — E038 Other specified hypothyroidism: Secondary | ICD-10-CM

## 2024-07-10 DIAGNOSIS — R7301 Impaired fasting glucose: Secondary | ICD-10-CM

## 2024-07-10 DIAGNOSIS — I10 Essential (primary) hypertension: Secondary | ICD-10-CM | POA: Diagnosis not present

## 2024-07-10 DIAGNOSIS — E7849 Other hyperlipidemia: Secondary | ICD-10-CM

## 2024-07-10 MED ORDER — HYDROCHLOROTHIAZIDE 12.5 MG PO TABS: 12.5000 mg | ORAL_TABLET | Freq: Every day | ORAL | 0 refills | Status: AC | PRN

## 2024-07-10 MED ORDER — TELMISARTAN 40 MG PO TABS: 40.0000 mg | ORAL_TABLET | Freq: Every day | ORAL | 1 refills | Status: AC

## 2024-07-10 MED ORDER — VITAMIN D (ERGOCALCIFEROL) 1.25 MG (50000 UNIT) PO CAPS: 50000.0000 [IU] | ORAL_CAPSULE | ORAL | 1 refills | Status: AC

## 2024-07-10 MED ORDER — AMLODIPINE BESYLATE 5 MG PO TABS: 5.0000 mg | ORAL_TABLET | Freq: Two times a day (BID) | ORAL | 3 refills | Status: AC

## 2024-07-10 NOTE — Assessment & Plan Note (Signed)
 Uncontrolled Blood Pressure in Clinic: The patient reports adherence to losartan 40 mg daily and amlodipine  5 mg twice daily. She notes home blood pressure readings are generally below 140/90 and is currently asymptomatic in the clinic.  She reports ambulatory readings to less than 140/90 and reports white coat syndrome  No changes made to her current treatment regimen. She is encouraged to continue with her prescribed medications, monitor her blood pressure at home, and notify the clinic if readings consistently exceed 140/90.  The patient verbalized understanding of the plan of care. Lifestyle modifications were reinforced, including a low-sodium diet and increased physical activity.

## 2024-07-10 NOTE — Patient Instructions (Addendum)
 I appreciate the opportunity to provide care to you today!    Follow up:  4 months  Labs: please stop by the lab during the week to get your blood drawn (CBC, CMP, TSH, Lipid profile, HgA1c, Vit D)  For a Healthier YOU, I Recommend: Reducing your intake of sugar, sodium, carbohydrates, and saturated fats. Increasing your fiber intake by incorporating more whole grains, fruits, and vegetables into your meals. Setting healthy goals with a focus on lowering your consumption of carbs, sugar, and unhealthy fats. Adding variety to your diet by including a wide range of fruits and vegetables. Cutting back on soda and limiting processed foods as much as possible. Staying active: In addition to taking your weight loss medication, aim for at least 150 minutes of moderate-intensity physical activity each week for optimal results.   Please follow up if your symptoms worsen or fail to improve.   Please continue to a heart-healthy diet and increase your physical activities. Try to exercise for at least five days a week.    It was a pleasure to see you and I look forward to continuing to work together on your health and well-being. Please do not hesitate to call the office if you need care or have questions about your care.  In case of emergency, please visit the Emergency Department for urgent care, or contact our clinic at [phone] to schedule an appointment. We're here to help you!   Have a wonderful day and week. With Gratitude, Meade JENEANE Gerlach MSN, FNP-BC, PMHNP-BC

## 2024-07-10 NOTE — Assessment & Plan Note (Signed)
 Encouraged to increase his intake of vitamin D-rich foods such as fatty fish (e.g., salmon, mackerel, and sardines), fortified dairy products, egg yolks, and fortified cereals.

## 2024-07-10 NOTE — Progress Notes (Signed)
 Established Patient Office Visit  Subjective:  Patient ID: Erika Figueroa, female    DOB: [birth year]  Age: 43 y.o. MRN: [id number]  CC:  Chief Complaint  Patient presents with   Hypertension    Follow up visit    Medication Refill    Wants to know if she needs to keep taking the vitamin D  weekly or move to OTC daily     HPI Erika Figueroa is a 43 y.o. female with past medical history of essential hypertension, overweight, allergic rhinitis presents for f/u of  chronic medical conditions.  For the details of today's visit, please refer to the assessment and plan.     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+   Headache    Hypertension     Past Surgical History:  Procedure Laterality Date   BREAST BIOPSY Right 01/19/2022   positive   BREAST SURGERY     biopsy right   MASTECTOMY Right 02/18/2022   MASTECTOMY W/ SENTINEL NODE BIOPSY Right 02/18/2022   Procedure: MASTECTOMY WITH SENTINEL LYMPH NODE BIOPSY;  Surgeon: Rodolph Romano, MD;  Location: [facility];  Service: General;  Laterality: Right;   TUBAL LIGATION Bilateral 11/13/2017   Procedure: POST PARTUM TUBAL LIGATION;  Surgeon: Leonce Garnette BIRCH, MD;  Location: [facility];  Service: Gynecology;  Laterality: Bilateral;    Family History  Problem Relation Age of Onset   Diabetes Mother    ALS Mother    Hypertension Father    Alzheimer's disease Father    Hyperlipidemia Father    Dementia Father    Hypothyroidism Sister    Breast cancer Sister 52       no mass, but did hormone tx   Hyperlipidemia Brother    Hypertension Brother    Diabetes Maternal Aunt    Breast cancer Cousin        second paternal cousin    Social History   Socioeconomic History   Marital status: Married    Spouse name: Reggie   Number of children: 2   Years of education: Not on file   Highest education level: Not on file  Occupational History    Occupation: Public house manager  Tobacco Use   Smoking status: Never   Smokeless tobacco: Never  Vaping Use   Vaping status: Never Used  Substance and Sexual Activity   Alcohol use: Yes    Alcohol/week: 4.0 - 5.0 standard drinks of alcohol    Types: 4 - 5 Shots of liquor per week    Comment: occ   Drug use: No   Sexual activity: Yes    Partners: Male    Birth control/protection: Surgical    Comment: Tubal Ligation  Other Topics Concern   Not on file  Social History Narrative   Not on file   Social Drivers of Health   Financial Resource Strain: Low Risk  (03/02/2022)   Overall Financial Resource Strain (CARDIA)    Difficulty of Paying Living Expenses: Not very hard  Food Insecurity: No Food Insecurity (03/02/2022)   Hunger Vital Sign    Worried About Running Out of Food in the Last Year: Never true    Ran Out of Food in the Last Year: Never true  Transportation Needs: No Transportation Needs (03/02/2022)   PRAPARE - Administrator, Civil Service (Medical): No    Lack of Transportation (Non-Medical): No  Physical Activity: Patient Declined (03/02/2022)   Exercise Vital Sign    Days of Exercise per Week: Patient declined    Minutes of Exercise per Session: Patient declined  Stress: Stress Concern Present (03/02/2022)   [employer] - Occupational Stress Questionnaire    Feeling of Stress : To some extent  Social Connections: Moderately Integrated (03/02/2022)   Social Connection and Isolation Panel    Frequency of Communication with Friends and Family: Three times a week    Frequency of Social Gatherings with Friends and Family: Three times a week    Attends Religious Services: 1 to 4 times per year    Active Member of Clubs or Organizations: No    Attends Banker Meetings: Never    Marital Status: Married  Catering manager Violence: Not At Risk (03/02/2022)   Humiliation, Afraid, Rape, and Kick questionnaire    Fear of  Current or Ex-Partner: No    Emotionally Abused: No    Physically Abused: No    Sexually Abused: No    Outpatient Medications Prior to Visit  Medication Sig Dispense Refill   Multiple Vitamins-Minerals (WOMENS MULTI VITAMIN & MINERAL PO) Take by mouth daily.     amLODipine  (NORVASC ) 5 MG tablet TAKE ONE TABLET BY MOUTH TWICE DAILY 60 tablet 3   hydrochlorothiazide  (HYDRODIURIL ) 12.5 MG tablet Take 1 tablet (12.5 mg total) by mouth daily as needed. 30 tablet 0   telmisartan  (MICARDIS ) 40 MG tablet Take 1 tablet (40 mg total) by mouth daily. 90 tablet 1   Vitamin D , Ergocalciferol , (DRISDOL ) 1.25 MG (50000 UNIT) CAPS capsule Take 1 capsule (50,000 Units total) by mouth every 7 (seven) days. (Patient not taking: Reported on 07/10/2024) 20 capsule 1   No facility-administered medications prior to visit.    No Known Allergies  ROS Review of Systems  Constitutional:  Negative for chills and fever.  Eyes:  Negative for visual disturbance.  Respiratory:  Negative for chest tightness and shortness of breath.   Neurological:  Negative for dizziness and headaches.      Objective:    Physical Exam HENT:     Head: Normocephalic.     Mouth/Throat:     Mouth: Mucous membranes are moist.  Cardiovascular:     Rate and Rhythm: Normal rate.     Heart sounds: Normal heart sounds.  Pulmonary:     Effort: Pulmonary effort is normal.     Breath sounds: Normal breath sounds.  Neurological:     Mental Status: She is alert.     BP (!) 140/90   Pulse 85   Resp 16   Ht 6' (1.829 m)   Wt 227 lb (103 kg)   SpO2 99%   BMI 30.79 kg/m  Wt Readings from Last 3 Encounters:  07/10/24 227 lb (103 kg)  02/28/24 217 lb (98.4 kg)  12/06/23 237 lb 12.8 oz (107.9 kg)    Lab Results  Component Value Date   TSH 1.510 10/28/2023   Lab Results  Component Value Date   WBC 5.8 10/28/2023   HGB 13.2 10/28/2023   HCT 37.9 10/28/2023   MCV 87 10/28/2023   PLT 294 10/28/2023   Lab Results   Component Value Date   NA 139 02/28/2024   K 4.5 02/28/2024   CO2 18 (L) 02/28/2024   GLUCOSE 85 02/28/2024   BUN 14 02/28/2024   CREATININE 0.85 02/28/2024   BILITOT 0.6 10/28/2023   ALKPHOS 75 10/28/2023   AST  34 10/28/2023   ALT 32 10/28/2023   PROT 6.7 10/28/2023   ALBUMIN 4.5 10/28/2023   CALCIUM 9.3 02/28/2024   ANIONGAP 6 06/09/2022   EGFR 88 02/28/2024   Lab Results  Component Value Date   CHOL 175 10/28/2023   Lab Results  Component Value Date   HDL 60 10/28/2023   Lab Results  Component Value Date   LDLCALC 100 (H) 10/28/2023   Lab Results  Component Value Date   TRIG 79 10/28/2023   Lab Results  Component Value Date   CHOLHDL 2.9 10/28/2023   Lab Results  Component Value Date   HGBA1C 5.3 10/28/2023      Assessment & Plan:  Essential hypertension Assessment & Plan: Uncontrolled Blood Pressure in Clinic: The patient reports adherence to losartan 40 mg daily and amlodipine  5 mg twice daily. She notes home blood pressure readings are generally below 140/90 and is currently asymptomatic in the clinic.  She reports ambulatory readings to less than 140/90 and reports white coat syndrome  No changes made to her current treatment regimen. She is encouraged to continue with her prescribed medications, monitor her blood pressure at home, and notify the clinic if readings consistently exceed 140/90.  The patient verbalized understanding of the plan of care. Lifestyle modifications were reinforced, including a low-sodium diet and increased physical activity.   Orders: -     Telmisartan ; Take 1 tablet (40 mg total) by mouth daily.  Dispense: 90 tablet; Refill: 1 -     hydroCHLOROthiazide ; Take 1 tablet (12.5 mg total) by mouth daily as needed.  Dispense: 60 tablet; Refill: 0 -     amLODIPine  Besylate; Take 1 tablet (5 mg total) by mouth 2 (two) times daily.  Dispense: 90 tablet; Refill: 3  Vitamin D  deficiency Assessment & Plan: Encouraged to increase  his intake of vitamin D -rich foods such as fatty fish (e.g., salmon, mackerel, and sardines), fortified dairy products, egg yolks, and fortified cereals.   Orders: -     Vitamin D  (Ergocalciferol ); Take 1 capsule (50,000 Units total) by mouth every 7 (seven) days.  Dispense: 20 capsule; Refill: 1 -     VITAMIN D  25 Hydroxy (Vit-D Deficiency, Fractures)  IFG (impaired fasting glucose) -     Hemoglobin A1c  TSH (thyroid -stimulating hormone deficiency) -     TSH + free T4  Other hyperlipidemia -     Lipid panel -     CMP14+EGFR -     CBC with Differential/Platelet   Note: This chart has been completed using Engineer, civil (consulting) software, and while attempts have been made to ensure accuracy, certain words and phrases may not be transcribed as intended.    Follow-up: Return in about 4 months (around 11/10/2024).   Esmee Fallaw  Z Bacchus, FNP

## 2024-07-14 DIAGNOSIS — E7849 Other hyperlipidemia: Secondary | ICD-10-CM | POA: Diagnosis not present

## 2024-07-14 DIAGNOSIS — E559 Vitamin D deficiency, unspecified: Secondary | ICD-10-CM | POA: Diagnosis not present

## 2024-07-14 DIAGNOSIS — E038 Other specified hypothyroidism: Secondary | ICD-10-CM | POA: Diagnosis not present

## 2024-07-15 LAB — CBC WITH DIFFERENTIAL/PLATELET
Basophils Absolute: 0 x10E3/uL (ref 0.0–0.2)
Basos: 1 %
EOS (ABSOLUTE): 0.1 x10E3/uL (ref 0.0–0.4)
Eos: 1 %
Hematocrit: 39 % (ref 34.0–46.6)
Hemoglobin: 12.9 g/dL (ref 11.1–15.9)
Immature Grans (Abs): 0 x10E3/uL (ref 0.0–0.1)
Immature Granulocytes: 0 %
Lymphocytes Absolute: 1.6 x10E3/uL (ref 0.7–3.1)
Lymphs: 26 %
MCH: 31.1 pg (ref 26.6–33.0)
MCHC: 33.1 g/dL (ref 31.5–35.7)
MCV: 94 fL (ref 79–97)
Monocytes Absolute: 0.5 x10E3/uL (ref 0.1–0.9)
Monocytes: 7 %
Neutrophils Absolute: 4.1 x10E3/uL (ref 1.4–7.0)
Neutrophils: 65 %
Platelets: 283 x10E3/uL (ref 150–450)
RBC: 4.15 x10E6/uL (ref 3.77–5.28)
RDW: 13.7 % (ref 11.7–15.4)
WBC: 6.2 x10E3/uL (ref 3.4–10.8)

## 2024-07-15 LAB — CMP14+EGFR
ALT: 25 IU/L (ref 0–32)
AST: 26 IU/L (ref 0–40)
Albumin: 4.3 g/dL (ref 3.9–4.9)
Alkaline Phosphatase: 66 IU/L (ref 41–116)
BUN/Creatinine Ratio: 15 (ref 9–23)
BUN: 12 mg/dL (ref 6–24)
Bilirubin Total: 0.7 mg/dL (ref 0.0–1.2)
CO2: 21 mmol/L (ref 20–29)
Calcium: 9.2 mg/dL (ref 8.7–10.2)
Chloride: 104 mmol/L (ref 96–106)
Creatinine, Ser: 0.81 mg/dL (ref 0.57–1.00)
Globulin, Total: 2.4 g/dL (ref 1.5–4.5)
Glucose: 84 mg/dL (ref 70–99)
Potassium: 4.3 mmol/L (ref 3.5–5.2)
Sodium: 138 mmol/L (ref 134–144)
Total Protein: 6.7 g/dL (ref 6.0–8.5)
eGFR: 93 mL/min/1.73 (ref >59)

## 2024-07-15 LAB — LIPID PANEL
Chol/HDL Ratio: 2.8 ratio (ref 0.0–4.4)
Cholesterol, Total: 171 mg/dL (ref 100–199)
HDL: 61 mg/dL (ref >39)
LDL Chol Calc (NIH): 96 mg/dL (ref 0–99)
Triglycerides: 71 mg/dL (ref 0–149)
VLDL Cholesterol Cal: 14 mg/dL (ref 5–40)

## 2024-07-15 LAB — TSH+FREE T4
Free T4: 1.17 ng/dL (ref 0.82–1.77)
TSH: 1.77 u[IU]/mL (ref 0.450–4.500)

## 2024-07-15 LAB — HEMOGLOBIN A1C
Est. average glucose Bld gHb Est-mCnc: 97 mg/dL
Hgb A1c MFr Bld: 5 % (ref 4.8–5.6)

## 2024-07-15 LAB — VITAMIN D 25 HYDROXY (VIT D DEFICIENCY, FRACTURES): Vit D, 25-Hydroxy: 47.1 ng/mL (ref 30.0–100.0)

## 2024-07-16 ENCOUNTER — Ambulatory Visit: Payer: Self-pay | Admitting: Family Medicine

## 2024-07-17 NOTE — Progress Notes (Deleted)
 PCP:  Edman Meade PEDLAR, FNP   No chief complaint on file.    HPI:      Erika Figueroa is a 43 y.o. [phone] whose LMP was No LMP recorded., presents today for her annual examination.  Her menses are regular every 28-30 days, lasting 7 days.  Dysmenorrhea none. She does not have intermenstrual bleeding. Having occas night sweats with tamoxifen .  Sex activity: single partner, contraception - tubal ligation. No pain/bleeding. Last Pap: 03/21/20 Results were: no abnormalities /neg HPV DNA   Last mammogram: 01/10/24  Results were: normal LT breast, repeat in 12 months. 5/23 cat 5 RT breast; pt noticed breast mass on SBE. 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 Followed by oncology.  Ambry BRCA+ testing neg.  ON TAMOX??? There is a FH of breast cancer in her sister and pat 2nd cousin. There is no FH of ovarian cancer. The patient does not do self-breast exams.   Tobacco use: The patient denies current or previous tobacco use. Alcohol use: none No drug use.  Exercise: moderately active  She does get adequate calcium and Vitamin D  in her diet. 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   Vaginitis and vulvovaginitis 12/26/2009   Overweight 12/04/2009   Essential hypertension 12/04/2009   Allergic rhinitis 12/04/2009   FATIGUE 12/04/2009    Past Surgical History:  Procedure Laterality Date   BREAST BIOPSY Right 01/19/2022    positive   BREAST SURGERY     biopsy right   MASTECTOMY Right 02/18/2022   MASTECTOMY W/ SENTINEL NODE BIOPSY Right 02/18/2022   Procedure: MASTECTOMY WITH SENTINEL LYMPH NODE BIOPSY;  Surgeon: Rodolph Romano, MD;  Location: [facility];  Service: General;  Laterality: Right;   TUBAL LIGATION Bilateral 11/13/2017   Procedure: POST PARTUM TUBAL LIGATION;  Surgeon: Leonce Garnette BIRCH, MD;  Location: [facility];  Service: Gynecology;  Laterality: Bilateral;    Family History  Problem Relation Age of Onset   Diabetes Mother    ALS Mother    Hypertension Father    Alzheimer's disease Father    Hyperlipidemia Father    Dementia Father    Hypothyroidism Sister    Breast cancer Sister 65       no mass, but did hormone tx   Hyperlipidemia Brother    Hypertension Brother    Diabetes Maternal Aunt    Breast cancer Cousin        second paternal cousin    Social History   Socioeconomic History   Marital status: Married    Spouse name: Reggie   Number of children: 2   Years of education: Not on file   Highest education level: Not on file  Occupational History   Occupation: public house manager  Tobacco Use   Smoking status: Never   Smokeless tobacco: Never  Vaping Use   Vaping status: Never Used  Substance and Sexual Activity   Alcohol use: Yes    Alcohol/week:  4.0 - 5.0 standard drinks of alcohol    Types: 4 - 5 Shots of liquor per week    Comment: occ   Drug use: No   Sexual activity: Yes    Partners: Male    Birth control/protection: Surgical    Comment: Tubal Ligation  Other Topics Concern   Not on file  Social History Narrative   Not on file   Social Drivers of Health   Financial Resource Strain: Low Risk  (03/02/2022)   Overall Financial Resource Strain (CARDIA)    Difficulty of Paying Living Expenses: Not very hard  Food Insecurity: No Food Insecurity (03/02/2022)   Hunger Vital Sign    Worried About Running Out of Food in the Last Year: Never true    Ran Out  of Food in the Last Year: Never true  Transportation Needs: No Transportation Needs (03/02/2022)   PRAPARE - Administrator, Civil Service (Medical): No    Lack of Transportation (Non-Medical): No  Physical Activity: Patient Declined (03/02/2022)   Exercise Vital Sign    Days of Exercise per Week: Patient declined    Minutes of Exercise per Session: Patient declined  Stress: Stress Concern Present (03/02/2022)   [employer] of Occupational Health - Occupational Stress Questionnaire    Feeling of Stress : To some extent  Social Connections: Moderately Integrated (03/02/2022)   Social Connection and Isolation Panel    Frequency of Communication with Friends and Family: Three times a week    Frequency of Social Gatherings with Friends and Family: Three times a week    Attends Religious Services: 1 to 4 times per year    Active Member of Clubs or Organizations: No    Attends Banker Meetings: Never    Marital Status: Married  Catering Manager Violence: Not At Risk (03/02/2022)   Humiliation, Afraid, Rape, and Kick questionnaire    Fear of Current or Ex-Partner: No    Emotionally Abused: No    Physically Abused: No    Sexually Abused: No     Current Outpatient Medications:    amLODipine  (NORVASC ) 5 MG tablet, Take 1 tablet (5 mg total) by mouth 2 (two) times daily., Disp: 90 tablet, Rfl: 3   hydrochlorothiazide  (HYDRODIURIL ) 12.5 MG tablet, Take 1 tablet (12.5 mg total) by mouth daily as needed., Disp: 60 tablet, Rfl: 0   Multiple Vitamins-Minerals (WOMENS MULTI VITAMIN & MINERAL PO), Take by mouth daily., Disp: , Rfl:    telmisartan  (MICARDIS ) 40 MG tablet, Take 1 tablet (40 mg total) by mouth daily., Disp: 90 tablet, Rfl: 1   Vitamin D , Ergocalciferol , (DRISDOL ) 1.25 MG (50000 UNIT) CAPS capsule, Take 1 capsule (50,000 Units total) by mouth every 7 (seven) days., Disp: 20 capsule, Rfl: 1     ROS:  Review of Systems  Constitutional:  Negative for  fatigue, fever and unexpected weight change.  Respiratory:  Negative for cough, shortness of breath and wheezing.   Cardiovascular:  Negative for chest pain, palpitations and leg swelling.  Gastrointestinal:  Negative for blood in stool, constipation, diarrhea, nausea and vomiting.  Endocrine: Negative for cold intolerance, heat intolerance and polyuria.  Genitourinary:  Negative for dyspareunia, dysuria, flank pain, frequency, genital sores, hematuria, menstrual problem, pelvic pain, urgency, vaginal bleeding, vaginal discharge and vaginal pain.  Musculoskeletal:  Negative for back pain, joint swelling and myalgias.  Skin:  Negative for rash.  Neurological:  Negative for dizziness, syncope, light-headedness, numbness and headaches.  Hematological:  Negative for adenopathy.  Psychiatric/Behavioral:  Negative for agitation, confusion, sleep disturbance and suicidal ideas. The patient is not nervous/anxious.    BREAST: No symptoms   Objective: There were no vitals taken for this visit.   Physical Exam Constitutional:      Appearance: She is well-developed.  Genitourinary:     Vulva normal.     Genitourinary Comments: RT BREAST ABSENT     Right Labia: No rash, tenderness or lesions.    Left Labia: No tenderness, lesions or rash.    No vaginal discharge, erythema or tenderness.      Right Adnexa: not tender and no mass present.    Left Adnexa: not tender and no mass present.    No cervical friability or polyp.     Uterus is not enlarged or tender.  Breasts:    Right: Absent. No mass, nipple discharge, skin change or tenderness.     Left: No mass, nipple discharge, skin change or tenderness.  Neck:     Thyroid : No thyromegaly.  Cardiovascular:     Rate and Rhythm: Normal rate and regular rhythm.     Heart sounds: Normal heart sounds. No murmur heard. Pulmonary:     Effort: Pulmonary effort is normal.     Breath sounds: Normal breath sounds.  Abdominal:     Palpations: Abdomen  is soft.     Tenderness: There is no abdominal tenderness. There is no guarding or rebound.  Musculoskeletal:        General: Normal range of motion.     Cervical back: Normal range of motion.  Lymphadenopathy:     Cervical: No cervical adenopathy.  Neurological:     General: No focal deficit present.     Mental Status: She is alert and oriented to person, place, and time.     Cranial Nerves: No cranial nerve deficit.  Skin:    General: Skin is warm and dry.  Psychiatric:        Mood and Affect: Mood normal.        Behavior: Behavior normal.        Thought Content: Thought content normal.        Judgment: Judgment normal.  Vitals reviewed.     Assessment/Plan: Encounter for annual routine gynecological examination  Encounter for screening mammogram for malignant neoplasm of breast; pt due for LT breast mammo 4/24, ordered through oncology.  Ductal carcinoma in situ (DCIS) of right breast--followed by DR. Melanee  History of tamoxifen  therapy--f/u prn [facility]  Blood tests for routine general physical examination--will do fasting labs next yr.             GYN counsel breast self exam, mammography screening, adequate intake of calcium and vitamin D , diet and exercise     F/U  No follow-ups on file.  Era Parr B. Mical Brun, PA-C 07/17/2024 1:10 PM

## 2024-07-18 ENCOUNTER — Ambulatory Visit: Admitting: Obstetrics and Gynecology

## 2024-07-18 DIAGNOSIS — D0511 Intraductal carcinoma in situ of right breast: Secondary | ICD-10-CM

## 2024-07-18 DIAGNOSIS — Z1151 Encounter for screening for human papillomavirus (HPV): Secondary | ICD-10-CM

## 2024-07-18 DIAGNOSIS — Z1231 Encounter for screening mammogram for malignant neoplasm of breast: Secondary | ICD-10-CM

## 2024-07-18 DIAGNOSIS — Z79810 Long term (current) use of selective estrogen receptor modulators (SERMs): Secondary | ICD-10-CM

## 2024-07-18 DIAGNOSIS — Z01419 Encounter for gynecological examination (general) (routine) without abnormal findings: Secondary | ICD-10-CM

## 2024-07-18 DIAGNOSIS — Z124 Encounter for screening for malignant neoplasm of cervix: Secondary | ICD-10-CM

## 2024-08-08 NOTE — Progress Notes (Unsigned)
 PCP:  Edman Meade PEDLAR, FNP   No chief complaint on file.    HPI:      [name] is a 43 y.o. [phone] whose LMP was No LMP recorded., presents today for her annual examination.  Her menses are regular every 28-30 days, lasting 7 days.  Dysmenorrhea none. She does not have intermenstrual bleeding. Having occas night sweats with tamoxifen .  Sex activity: single partner, contraception - tubal ligation. No pain/bleeding. Last Pap: 03/21/20 Results were: no abnormalities /neg HPV DNA   Last mammogram: 01/10/24  Results were: normal LT breast, repeat in 12 months. 5/23 cat 5 RT breast; pt noticed breast mass on SBE. 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 Followed by oncology.  Ambry BRCA+ testing neg.  ON TAMOX??? There is a FH of breast cancer in her sister and pat 2nd cousin. There is no FH of ovarian cancer. The patient does not do self-breast exams.   Tobacco use: The patient denies current or previous tobacco use. Alcohol use: none No drug use.  Exercise: moderately active  She does get adequate calcium and Vitamin D  in her diet. 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   Vaginitis and vulvovaginitis 12/26/2009   Overweight 12/04/2009   Essential hypertension 12/04/2009   Allergic rhinitis 12/04/2009   FATIGUE 12/04/2009    Past Surgical History:  Procedure Laterality Date   BREAST BIOPSY Right 01/19/2022    positive   BREAST SURGERY     biopsy right   MASTECTOMY Right 02/18/2022   MASTECTOMY W/ SENTINEL NODE BIOPSY Right 02/18/2022   Procedure: MASTECTOMY WITH SENTINEL LYMPH NODE BIOPSY;  Surgeon: Rodolph Romano, MD;  Location: [facility];  Service: General;  Laterality: Right;   TUBAL LIGATION Bilateral 11/13/2017   Procedure: POST PARTUM TUBAL LIGATION;  Surgeon: Leonce Garnette BIRCH, MD;  Location: [facility];  Service: Gynecology;  Laterality: Bilateral;    Family History  Problem Relation Age of Onset   Diabetes Mother    ALS Mother    Hypertension Father    Alzheimer's disease Father    Hyperlipidemia Father    Dementia Father    Hypothyroidism Sister    Breast cancer Sister 68       no mass, but did hormone tx   Hyperlipidemia Brother    Hypertension Brother    Diabetes Maternal Aunt    Breast cancer Cousin        second paternal cousin    Social History   Socioeconomic History   Marital status: Married    Spouse name: Reggie   Number of children: 2   Years of education: Not on file   Highest education level: Not on file  Occupational History   Occupation: public house manager  Tobacco Use   Smoking status: Never   Smokeless tobacco: Never  Vaping Use   Vaping status: Never Used  Substance and Sexual Activity   Alcohol use: Yes    Alcohol/week:  4.0 - 5.0 standard drinks of alcohol    Types: 4 - 5 Shots of liquor per week    Comment: occ   Drug use: No   Sexual activity: Yes    Partners: Male    Birth control/protection: Surgical    Comment: Tubal Ligation  Other Topics Concern   Not on file  Social History Narrative   Not on file   Social Drivers of Health   Financial Resource Strain: Low Risk  (03/02/2022)   Overall Financial Resource Strain (CARDIA)    Difficulty of Paying Living Expenses: Not very hard  Food Insecurity: No Food Insecurity (03/02/2022)   Hunger Vital Sign    Worried About Running Out of Food in the Last Year: Never true    Ran Out  of Food in the Last Year: Never true  Transportation Needs: No Transportation Needs (03/02/2022)   PRAPARE - Administrator, Civil Service (Medical): No    Lack of Transportation (Non-Medical): No  Physical Activity: Patient Declined (03/02/2022)   Exercise Vital Sign    Days of Exercise per Week: Patient declined    Minutes of Exercise per Session: Patient declined  Stress: Stress Concern Present (03/02/2022)   [employer] of Occupational Health - Occupational Stress Questionnaire    Feeling of Stress : To some extent  Social Connections: Moderately Integrated (03/02/2022)   Social Connection and Isolation Panel    Frequency of Communication with Friends and Family: Three times a week    Frequency of Social Gatherings with Friends and Family: Three times a week    Attends Religious Services: 1 to 4 times per year    Active Member of Clubs or Organizations: No    Attends Banker Meetings: Never    Marital Status: Married  Catering Manager Violence: Not At Risk (03/02/2022)   Humiliation, Afraid, Rape, and Kick questionnaire    Fear of Current or Ex-Partner: No    Emotionally Abused: No    Physically Abused: No    Sexually Abused: No     Current Outpatient Medications:    amLODipine  (NORVASC ) 5 MG tablet, Take 1 tablet (5 mg total) by mouth 2 (two) times daily., Disp: 90 tablet, Rfl: 3   hydrochlorothiazide  (HYDRODIURIL ) 12.5 MG tablet, Take 1 tablet (12.5 mg total) by mouth daily as needed., Disp: 60 tablet, Rfl: 0   Multiple Vitamins-Minerals (WOMENS MULTI VITAMIN & MINERAL PO), Take by mouth daily., Disp: , Rfl:    telmisartan  (MICARDIS ) 40 MG tablet, Take 1 tablet (40 mg total) by mouth daily., Disp: 90 tablet, Rfl: 1   Vitamin D , Ergocalciferol , (DRISDOL ) 1.25 MG (50000 UNIT) CAPS capsule, Take 1 capsule (50,000 Units total) by mouth every 7 (seven) days., Disp: 20 capsule, Rfl: 1     ROS:  Review of Systems  Constitutional:  Negative for  fatigue, fever and unexpected weight change.  Respiratory:  Negative for cough, shortness of breath and wheezing.   Cardiovascular:  Negative for chest pain, palpitations and leg swelling.  Gastrointestinal:  Negative for blood in stool, constipation, diarrhea, nausea and vomiting.  Endocrine: Negative for cold intolerance, heat intolerance and polyuria.  Genitourinary:  Negative for dyspareunia, dysuria, flank pain, frequency, genital sores, hematuria, menstrual problem, pelvic pain, urgency, vaginal bleeding, vaginal discharge and vaginal pain.  Musculoskeletal:  Negative for back pain, joint swelling and myalgias.  Skin:  Negative for rash.  Neurological:  Negative for dizziness, syncope, light-headedness, numbness and headaches.  Hematological:  Negative for adenopathy.  Psychiatric/Behavioral:  Negative for agitation, confusion, sleep disturbance and suicidal ideas. The patient is not nervous/anxious.    BREAST: No symptoms   Objective: There were no vitals taken for this visit.   Physical Exam Constitutional:      Appearance: She is well-developed.  Genitourinary:     Vulva normal.     Genitourinary Comments: RT BREAST ABSENT     Right Labia: No rash, tenderness or lesions.    Left Labia: No tenderness, lesions or rash.    No vaginal discharge, erythema or tenderness.      Right Adnexa: not tender and no mass present.    Left Adnexa: not tender and no mass present.    No cervical friability or polyp.     Uterus is not enlarged or tender.  Breasts:    Right: Absent. No mass, nipple discharge, skin change or tenderness.     Left: No mass, nipple discharge, skin change or tenderness.  Neck:     Thyroid : No thyromegaly.  Cardiovascular:     Rate and Rhythm: Normal rate and regular rhythm.     Heart sounds: Normal heart sounds. No murmur heard. Pulmonary:     Effort: Pulmonary effort is normal.     Breath sounds: Normal breath sounds.  Abdominal:     Palpations: Abdomen  is soft.     Tenderness: There is no abdominal tenderness. There is no guarding or rebound.  Musculoskeletal:        General: Normal range of motion.     Cervical back: Normal range of motion.  Lymphadenopathy:     Cervical: No cervical adenopathy.  Neurological:     General: No focal deficit present.     Mental Status: She is alert and oriented to person, place, and time.     Cranial Nerves: No cranial nerve deficit.  Skin:    General: Skin is warm and dry.  Psychiatric:        Mood and Affect: Mood normal.        Behavior: Behavior normal.        Thought Content: Thought content normal.        Judgment: Judgment normal.  Vitals reviewed.     Assessment/Plan: Encounter for annual routine gynecological examination  Encounter for screening mammogram for malignant neoplasm of breast; pt due for LT breast mammo 4/24, ordered through oncology.  Ductal carcinoma in situ (DCIS) of right breast--followed by DR. Melanee  History of tamoxifen  therapy--f/u prn [facility]  Blood tests for routine general physical examination--will do fasting labs next yr.             GYN counsel breast self exam, mammography screening, adequate intake of calcium and vitamin D , diet and exercise     F/U  No follow-ups on file.  Blase Beckner B. Leilanie Rauda, PA-C 08/08/2024 4:52 PM

## 2024-08-09 ENCOUNTER — Encounter: Payer: Self-pay | Admitting: Obstetrics and Gynecology

## 2024-08-09 ENCOUNTER — Ambulatory Visit (INDEPENDENT_AMBULATORY_CARE_PROVIDER_SITE_OTHER): Admitting: Obstetrics and Gynecology

## 2024-08-09 ENCOUNTER — Other Ambulatory Visit (HOSPITAL_COMMUNITY)
Admission: RE | Admit: 2024-08-09 | Discharge: 2024-08-09 | Disposition: A | Source: Ambulatory Visit | Attending: Obstetrics and Gynecology | Admitting: Obstetrics and Gynecology

## 2024-08-09 VITALS — BP 123/83 | HR 96 | Ht 72.0 in | Wt 224.0 lb

## 2024-08-09 DIAGNOSIS — Z01419 Encounter for gynecological examination (general) (routine) without abnormal findings: Secondary | ICD-10-CM

## 2024-08-09 DIAGNOSIS — Z9229 Personal history of other drug therapy: Secondary | ICD-10-CM

## 2024-08-09 DIAGNOSIS — D0511 Intraductal carcinoma in situ of right breast: Secondary | ICD-10-CM | POA: Diagnosis not present

## 2024-08-09 DIAGNOSIS — R399 Unspecified symptoms and signs involving the genitourinary system: Secondary | ICD-10-CM | POA: Diagnosis not present

## 2024-08-09 DIAGNOSIS — R102 Pelvic and perineal pain unspecified side: Secondary | ICD-10-CM | POA: Diagnosis not present

## 2024-08-09 DIAGNOSIS — Z124 Encounter for screening for malignant neoplasm of cervix: Secondary | ICD-10-CM | POA: Insufficient documentation

## 2024-08-09 DIAGNOSIS — Z9011 Acquired absence of right breast and nipple: Secondary | ICD-10-CM

## 2024-08-09 DIAGNOSIS — Z01411 Encounter for gynecological examination (general) (routine) with abnormal findings: Secondary | ICD-10-CM | POA: Diagnosis not present

## 2024-08-09 DIAGNOSIS — Z1151 Encounter for screening for human papillomavirus (HPV): Secondary | ICD-10-CM | POA: Insufficient documentation

## 2024-08-09 LAB — POCT URINALYSIS DIPSTICK
Bilirubin, UA: NEGATIVE
Blood, UA: NEGATIVE
Glucose, UA: NEGATIVE
Ketones, UA: NEGATIVE
Leukocytes, UA: NEGATIVE
Nitrite, UA: NEGATIVE
Protein, UA: NEGATIVE
Spec Grav, UA: 1.02 (ref 1.010–1.025)
pH, UA: 6 (ref 5.0–8.0)

## 2024-08-09 NOTE — Patient Instructions (Signed)
 I value your feedback and you entrusting [place] with your care. If you get a [place] patient survey, I would appreciate you taking the time to let us know about your experience today. Thank you! ? ? ?

## 2024-08-11 ENCOUNTER — Ambulatory Visit: Payer: Self-pay | Admitting: Obstetrics and Gynecology

## 2024-08-11 DIAGNOSIS — N83201 Unspecified ovarian cyst, right side: Secondary | ICD-10-CM

## 2024-08-11 LAB — URINE CULTURE

## 2024-08-11 NOTE — Progress Notes (Signed)
 Pls call pt to schedule GYN u/s

## 2024-08-11 NOTE — Telephone Encounter (Signed)
 Contacted the patient via phone to scheduled GYN ultrasound per Bernarda Copland. Call couldn't be completed as dialed.

## 2024-08-11 NOTE — Addendum Note (Signed)
 Addended by: WATT HILA B on: 08/11/2024 01:24 PM   Modules accepted: Orders

## 2024-08-14 LAB — CYTOLOGY - PAP
Comment: NEGATIVE
Diagnosis: NEGATIVE
High risk HPV: NEGATIVE

## 2024-08-25 ENCOUNTER — Other Ambulatory Visit

## 2024-08-25 ENCOUNTER — Ambulatory Visit: Admission: RE | Admit: 2024-08-25

## 2024-10-11 ENCOUNTER — Ambulatory Visit
Admission: RE | Admit: 2024-10-11 | Discharge: 2024-10-11 | Disposition: A | Discharge status: Home / Self Care | Source: Ambulatory Visit | Attending: Obstetrics and Gynecology | Admitting: Obstetrics and Gynecology

## 2024-10-11 DIAGNOSIS — R102 Pelvic and perineal pain unspecified side: Secondary | ICD-10-CM | POA: Diagnosis present

## 2024-10-18 NOTE — Addendum Note (Signed)
 Addended by: WATT HILA B on: 10/18/2024 09:17 AM   Modules accepted: Orders

## 2024-10-18 NOTE — Telephone Encounter (Signed)
 Pls call pt to schedule f/u u/s in March. Thx.

## 2024-10-25 ENCOUNTER — Other Ambulatory Visit

## 2024-10-26 ENCOUNTER — Telehealth: Payer: Self-pay | Admitting: Obstetrics and Gynecology

## 2024-10-26 ENCOUNTER — Other Ambulatory Visit: Payer: Self-pay | Admitting: Obstetrics and Gynecology

## 2024-10-26 DIAGNOSIS — Z1231 Encounter for screening mammogram for malignant neoplasm of breast: Secondary | ICD-10-CM

## 2024-10-26 NOTE — Telephone Encounter (Signed)
 Reached out to pt to reschedule gyn US  that was scheduled on 10/25/2024 at 4:00 per [facility].  Pt stated that she no longer needed the appt bc she had the US  done at the hospital.

## 2024-11-13 ENCOUNTER — Ambulatory Visit: Admitting: Family Medicine

## 2025-01-22 ENCOUNTER — Encounter
